# Patient Record
Sex: Female | Born: 2000 | Race: Black or African American | Hispanic: No | Marital: Single | Smoking: Never smoker
Health system: Southern US, Community
[De-identification: ages and names within clinical notes are randomized; demographics above are authoritative.]

## PROBLEM LIST (undated history)

## (undated) DIAGNOSIS — D649 Anemia, unspecified: Secondary | ICD-10-CM

## (undated) DIAGNOSIS — Z5189 Encounter for other specified aftercare: Secondary | ICD-10-CM

---
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
## 2021-08-13 ENCOUNTER — Encounter: Payer: Self-pay | Admitting: Emergency Medicine

## 2021-08-13 ENCOUNTER — Emergency Department
Admission: EM | Admit: 2021-08-13 | Discharge: 2021-08-13 | Disposition: A | Discharge status: Home / Self Care | Payer: Medicaid Other | Attending: Emergency Medicine | Admitting: Emergency Medicine

## 2021-08-13 ENCOUNTER — Emergency Department: Payer: Medicaid Other

## 2021-08-13 ENCOUNTER — Other Ambulatory Visit: Payer: Self-pay

## 2021-08-13 DIAGNOSIS — O99011 Anemia complicating pregnancy, first trimester: Secondary | ICD-10-CM | POA: Insufficient documentation

## 2021-08-13 DIAGNOSIS — O26899 Other specified pregnancy related conditions, unspecified trimester: Secondary | ICD-10-CM

## 2021-08-13 DIAGNOSIS — Z3A01 Less than 8 weeks gestation of pregnancy: Secondary | ICD-10-CM | POA: Diagnosis not present

## 2021-08-13 DIAGNOSIS — N9489 Other specified conditions associated with female genital organs and menstrual cycle: Secondary | ICD-10-CM | POA: Diagnosis not present

## 2021-08-13 DIAGNOSIS — O2341 Unspecified infection of urinary tract in pregnancy, first trimester: Secondary | ICD-10-CM | POA: Diagnosis not present

## 2021-08-13 DIAGNOSIS — D649 Anemia, unspecified: Secondary | ICD-10-CM | POA: Diagnosis not present

## 2021-08-13 DIAGNOSIS — O26891 Other specified pregnancy related conditions, first trimester: Secondary | ICD-10-CM | POA: Diagnosis present

## 2021-08-13 DIAGNOSIS — R102 Pelvic and perineal pain: Secondary | ICD-10-CM

## 2021-08-13 DIAGNOSIS — N390 Urinary tract infection, site not specified: Secondary | ICD-10-CM

## 2021-08-13 LAB — URINALYSIS, ROUTINE W REFLEX MICROSCOPIC
Bilirubin Urine: NEGATIVE
Glucose, UA: NEGATIVE mg/dL
Hgb urine dipstick: NEGATIVE
Ketones, ur: NEGATIVE mg/dL
Nitrite: NEGATIVE
Protein, ur: NEGATIVE mg/dL
Specific Gravity, Urine: 1.024 (ref 1.005–1.030)
pH: 5 (ref 5.0–8.0)

## 2021-08-13 LAB — COMPREHENSIVE METABOLIC PANEL
ALT: 12 U/L (ref 0–44)
AST: 14 U/L — ABNORMAL LOW (ref 15–41)
Albumin: 3.7 g/dL (ref 3.5–5.0)
Alkaline Phosphatase: 49 U/L (ref 38–126)
Anion gap: 4 — ABNORMAL LOW (ref 5–15)
BUN: 11 mg/dL (ref 6–20)
CO2: 24 mmol/L (ref 22–32)
Calcium: 8.9 mg/dL (ref 8.9–10.3)
Chloride: 106 mmol/L (ref 98–111)
Creatinine, Ser: 0.63 mg/dL (ref 0.44–1.00)
GFR, Estimated: >60 mL/min (ref >60)
Glucose, Bld: 94 mg/dL (ref 70–99)
Potassium: 3.6 mmol/L (ref 3.5–5.1)
Sodium: 134 mmol/L — ABNORMAL LOW (ref 135–145)
Total Bilirubin: 0.4 mg/dL (ref 0.3–1.2)
Total Protein: 7.4 g/dL (ref 6.5–8.1)

## 2021-08-13 LAB — CBC
HCT: 27.8 % — ABNORMAL LOW (ref 36.0–46.0)
Hemoglobin: 7.2 g/dL — ABNORMAL LOW (ref 12.0–15.0)
MCH: 16 pg — ABNORMAL LOW (ref 26.0–34.0)
MCHC: 25.9 g/dL — ABNORMAL LOW (ref 30.0–36.0)
MCV: 61.8 fL — ABNORMAL LOW (ref 80.0–100.0)
Platelets: 442 10*3/uL — ABNORMAL HIGH (ref 150–400)
RBC: 4.5 MIL/uL (ref 3.87–5.11)
RDW: 19.9 % — ABNORMAL HIGH (ref 11.5–15.5)
WBC: 7.1 10*3/uL (ref 4.0–10.5)
nRBC: 0 % (ref 0.0–0.2)

## 2021-08-13 LAB — POC URINE PREG, ED: Preg Test, Ur: POSITIVE — AB

## 2021-08-13 LAB — HCG, QUANTITATIVE, PREGNANCY: hCG, Beta Chain, Quant, S: 15964 m[IU]/mL — ABNORMAL HIGH (ref <5)

## 2021-08-13 LAB — LIPASE, BLOOD: Lipase: 61 U/L — ABNORMAL HIGH (ref 11–51)

## 2021-08-13 MED ORDER — FERROUS SULFATE 325 (65 FE) MG PO TABS: 325.0000 mg | ORAL_TABLET | Freq: Every day | ORAL | 3 refills | Status: DC

## 2021-08-13 MED ORDER — NITROFURANTOIN MONOHYD MACRO 100 MG PO CAPS
100.0000 mg | ORAL_CAPSULE | Freq: Once | ORAL | Status: AC
Start: 2021-08-13 — End: 2021-08-13
  Administered 2021-08-13: 100 mg via ORAL
  Filled 2021-08-13: qty 1

## 2021-08-13 MED ORDER — NITROFURANTOIN MONOHYD MACRO 100 MG PO CAPS: 100.0000 mg | ORAL_CAPSULE | Freq: Two times a day (BID) | ORAL | 0 refills | Status: AC

## 2021-08-13 NOTE — Discharge Instructions (Addendum)
I recommend that you continue daily prenatal vitamins.  You may take over-the-counter Tylenol 1000 mg every 6 hours as needed for pain.  I recommend follow-up with OB/GYN for repeat ultrasound in 10 to 14 days.

## 2021-08-13 NOTE — ED Notes (Signed)
Discharge instructions, script and follow-up information provided to patient. Patient verbalized understanding. Patient ambulated out to the waiting room with a steady gait. °

## 2021-08-13 NOTE — ED Provider Notes (Signed)
[facility] Provider Note    Event Date/Time   First MD Initiated Contact with Patient 08/13/21 [phone]     (approximate)   History   Abdominal Pain   HPI  Brenda Carpenter is a 21 y.o. female G1, P0 with history of iron deficiency anemia who presents to the emergency department with complaints of abdominal cramping in the lower abdomen for the past 2 days.  She states that she has had a positive pregnancy test at home and was concerned that something could be going on with her pregnancy.  She does not have an OB/GYN.  Her LMP was January 9.  She denies any vaginal bleeding, abnormal discharge, dysuria or hematuria.  No fevers, chills, nausea or vomiting.  States she did have some diarrhea that has resolved.  No bloody stools or melena.  Denies history of abdominal surgeries.  Denies history of previous STDs.      History provided by patient.    No past medical history on file.  No past surgical history on file.  MEDICATIONS:  Prior to Admission medications   Medication Sig Start Date End Date Taking? Authorizing Provider  docusate sodium (COLACE) 100 MG capsule Take 1 tablet once or twice daily as needed for constipation while taking iron supplements 05/20/20   Hinda Kehr, MD  ferrous sulfate 325 (65 FE) MG tablet Take 1 tablet (325 mg total) by mouth daily with breakfast. 05/20/20 06/19/20  Hinda Kehr, MD  ibuprofen (ADVIL,MOTRIN) 400 MG tablet Take 1 tablet (400 mg total) by mouth every 6 (six) hours as needed. 09/06/15   Hagler, Jami L, PA-C  ketorolac (TORADOL) 10 MG tablet Take 1 tablet (10 mg total) every 8 (eight) hours by mouth. Patient not taking: Reported on 01/29/2020 05/10/17   Menshew, Dannielle Karvonen, PA-C  medroxyPROGESTERone (PROVERA) 5 MG tablet Take 2 tablets (10 mg total) by mouth daily for 5 days. 05/20/20 05/25/20  Hinda Kehr, MD  metoCLOPramide (REGLAN) 5 MG tablet Take 1 tablet (5 mg total) every 8 (eight) hours as needed by mouth  for nausea or vomiting. Patient not taking: Reported on 01/29/2020 05/10/17   Melvenia Needles, PA-C    Physical Exam   Triage Vital Signs: ED Triage Vitals [08/13/21 0155]  Enc Vitals Group     BP (!) 108/56     Pulse Rate 79     Resp 16     Temp 98.8 F (37.1 C)     Temp Source Oral     SpO2 100 %     Weight 134 lb (60.8 kg)     Height 5\' 1"  (1.549 m)     Head Circumference      Peak Flow      Pain Score 5     Pain Loc      Pain Edu?      Excl. in [place]?     Most recent vital signs: Vitals:   08/13/21 0155  BP: (!) 108/56  Pulse: 79  Resp: 16  Temp: 98.8 F (37.1 C)  SpO2: 100%    CONSTITUTIONAL: Alert and oriented and responds appropriately to questions. Well-appearing; well-nourished HEAD: Normocephalic, atraumatic EYES: Conjunctivae clear, pupils appear equal, sclera nonicteric ENT: normal nose; moist mucous membranes NECK: Supple, normal ROM CARD: RRR; S1 and S2 appreciated; no murmurs, no clicks, no rubs, no gallops RESP: Normal chest excursion without splinting or tachypnea; breath sounds clear and equal bilaterally; no wheezes, no rhonchi, no rales, no hypoxia  or respiratory distress, speaking full sentences ABD/GI: Normal bowel sounds; non-distended; soft, non-tender, no rebound, no guarding, no peritoneal signs, no significant tenderness at McBurney's point BACK: The back appears normal EXT: Normal ROM in all joints; no deformity noted, no edema; no cyanosis SKIN: Normal color for age and race; warm; no rash on exposed skin NEURO: Moves all extremities equally, normal speech PSYCH: The patient's mood and manner are appropriate.   ED Results / Procedures / Treatments   LABS: (all labs ordered are listed, but only abnormal results are displayed) Labs Reviewed  COMPREHENSIVE METABOLIC PANEL - Abnormal; Notable for the following components:      Result Value   Sodium 134 (*)    AST 14 (*)    Anion gap 4 (*)    All other components within normal  limits  CBC - Abnormal; Notable for the following components:   Hemoglobin 7.2 (*)    HCT 27.8 (*)    MCV 61.8 (*)    MCH 16.0 (*)    MCHC 25.9 (*)    RDW 19.9 (*)    Platelets 442 (*)    All other components within normal limits  URINALYSIS, ROUTINE W REFLEX MICROSCOPIC - Abnormal; Notable for the following components:   Color, Urine YELLOW (*)    APPearance CLOUDY (*)    Leukocytes,Ua LARGE (*)    Bacteria, UA RARE (*)    All other components within normal limits  HCG, QUANTITATIVE, PREGNANCY - Abnormal; Notable for the following components:   hCG, Beta Chain, Quant, S 15,964 (*)    All other components within normal limits  LIPASE, BLOOD - Abnormal; Notable for the following components:   Lipase 61 (*)    All other components within normal limits  POC URINE PREG, ED - Abnormal; Notable for the following components:   Preg Test, Ur POSITIVE (*)    All other components within normal limits  URINE CULTURE     EKG:   RADIOLOGY: My personal review and interpretation of imaging: Ultrasound shows single IUP with no fetal cardiac activity yet.  I have personally reviewed all radiology reports.   US OB LESS THAN 14 WEEKS W/ OB TRANSVAGINAL AND DOPPLER  Result Date: 08/13/2021 CLINICAL DATA:  Initial evaluation for acute pelvic pain, early pregnancy. EXAM: OBSTETRIC <14 WK [place] AND TRANSVAGINAL OB US DOPPLER ULTRASOUND OF OVARIES TECHNIQUE: Both transabdominal and transvaginal ultrasound examinations were performed for complete evaluation of the gestation as well as the maternal uterus, adnexal regions, and pelvic cul-de-sac. Transvaginal technique was performed to assess early pregnancy. Color and duplex Doppler ultrasound was utilized to evaluate blood flow to the ovaries. COMPARISON:  Prior ultrasound from 05/20/2020. FINDINGS: Intrauterine gestational sac: Single Yolk sac:  Present Embryo:  Present Cardiac Activity: Negative. Heart Rate: N/A CRL:   2.3 mm   5 w 5 d                   [place] EDC: 04/10/2022 Subchorionic hemorrhage:  None visualized. Maternal uterus/adnexae: Left ovary unremarkable and normal in appearance. 1.6 cm complex cyst within the right ovary, most likely a hemorrhagic cyst and/or degenerating corpus luteal cyst. No other adnexal mass. Trace free fluid seen within the pelvis. Pulsed Doppler evaluation of both ovaries demonstrates normal appearing low-resistance arterial and venous waveforms. IMPRESSION: 1. Single IUP with internal yolk sac and embryo, crown-rump length measures 2.3 mm, but no detectable cardiac activity. Findings are suspicious but not yet definitive for failed pregnancy. Recommend follow-up US  in 10-14 days for definitive diagnosis. This recommendation follows SRU consensus guidelines: Diagnostic Criteria for Nonviable Pregnancy Early in the First Trimester. [employer] Med 2013; [id number]. 2. 1.6 cm complex right ovarian cyst, likely a small hemorrhagic cyst or degenerating corpus luteal cyst. Associated trace free fluid within the pelvis. 3. No other acute abnormality within the pelvis. No evidence for ovarian torsion. Electronically Signed   By: Jeannine Boga M.D.   On: 08/13/2021 04:49     PROCEDURES:  Critical Care performed: No   CRITICAL CARE Performed by: Cyril Mourning Aleina Burgio   Total critical care time: 0 minutes  Critical care time was exclusive of separately billable procedures and treating other patients.  Critical care was necessary to treat or prevent imminent or life-threatening deterioration.  Critical care was time spent personally by me on the following activities: development of treatment plan with patient and/or surrogate as well as nursing, discussions with consultants, evaluation of patient's response to treatment, examination of patient, obtaining history from patient or surrogate, ordering and performing treatments and interventions, ordering and review of laboratory studies, ordering and review of radiographic studies,  pulse oximetry and re-evaluation of patient's condition.   Procedures    IMPRESSION / MDM / ASSESSMENT AND PLAN / ED COURSE  I reviewed the triage vital signs and the nursing notes.    Patient here with complaints of pelvic pain in pregnancy.  No bleeding.    DIFFERENTIAL DIAGNOSIS (includes but not limited to):   Pregnancy, ectopic, miscarriage, TOA, PID, torsion, UTI, kidney stone, pyelonephritis, less likely appendicitis, colitis, diverticulitis   PLAN: We will obtain CBC, CMP, lipase, urinalysis, hCG, transvaginal ultrasound.  Patient declines any pain medication.   MEDICATIONS GIVEN IN ED: Medications  nitrofurantoin (macrocrystal-monohydrate) (MACROBID) capsule 100 mg (100 mg Oral Given 08/13/21 0335)     ED COURSE: Patient's labs show no leukocytosis.  She is anemic but this appears stable and chronic.  She denies any bloody stools, melena, vaginal bleeding.  She is on iron tablets.  Her urine also appears infected versus contaminated.  We will add on a urine culture and start her on Macrobid given she is pregnant.  hCG is greater than 15,000.  LFTs normal.  Lipase is just minimally elevated at 61.  Abdominal exam is benign.  Will obtain transvaginal ultrasound.  Patient declines pelvic and STD screening today.   Ultrasound images reviewed by myself and radiologist.  Ultrasound shows a single IUP measuring about 5 weeks and 5 days but no detectable cardiac activity.  Discussed with patient that this is concerning for possible failed pregnancy but also could just be because it is so early on and we recommend repeat ultrasound as an outpatient in 10 to 14 days.  She does have a complex right ovarian cyst but no torsion.  Will give outpatient OB/GYN follow-up.  Recommended Tylenol as needed for pain control.  Will discharge with Macrobid for her UTI.   At this time, I do not feel there is any life-threatening condition present. I reviewed all nursing notes, vitals, pertinent  previous records.  All lab and urine results, EKGs, imaging ordered have been independently reviewed and interpreted by myself.  I reviewed all available radiology reports from any imaging ordered this visit.  Based on my assessment, I feel the patient is safe to be discharged home without further emergent workup and can continue workup as an outpatient as needed. Discussed all findings, treatment plan as well as usual and customary return precautions with patient.  They verbalize understanding and are comfortable with this plan.  Outpatient follow-up has been provided as needed.  All questions have been answered.    CONSULTS: No OB/GYN consultation needed given no ectopic and no torsion seen.   OUTSIDE RECORDS REVIEWED: No previous pertinent records for review other than ED notes.         FINAL CLINICAL IMPRESSION(S) / ED DIAGNOSES   Final diagnoses:  Pelvic pain in pregnancy  Anemia, unspecified type  Acute UTI     Rx / DC Orders   ED Discharge Orders          Ordered    nitrofurantoin, macrocrystal-monohydrate, (MACROBID) 100 MG capsule  2 times daily        08/13/21 0458    ferrous sulfate 325 (65 FE) MG tablet  Daily        08/13/21 0458             Note:  This document was prepared using Dragon voice recognition software and may include unintentional dictation errors.   Meshilem Machuca, Delice Bison, DO 08/13/21 [phone]

## 2021-08-13 NOTE — ED Notes (Signed)
Provider at bedside to update patient on test results and plan of care ?

## 2021-08-13 NOTE — ED Triage Notes (Signed)
Pt states generalized abd pain since this pm with diarrhea. Pt states she is [redacted] weeks pregnant, denies vomiting, nausea, fever or vaginal bleeding.

## 2021-08-13 NOTE — ED Notes (Signed)
Ultrasound at bedside to perform exam

## 2021-08-14 LAB — URINE CULTURE: Culture: 3000 — AB

## 2021-11-18 ENCOUNTER — Encounter: Payer: Self-pay | Admitting: Obstetrics and Gynecology

## 2021-11-18 ENCOUNTER — Observation Stay
Admission: EM | Admit: 2021-11-18 | Discharge: 2021-11-19 | Disposition: A | Discharge status: Home / Self Care | Payer: Medicaid Other | Attending: Obstetrics | Admitting: Obstetrics

## 2021-11-18 DIAGNOSIS — Z3A20 20 weeks gestation of pregnancy: Secondary | ICD-10-CM | POA: Insufficient documentation

## 2021-11-18 DIAGNOSIS — O219 Vomiting of pregnancy, unspecified: Secondary | ICD-10-CM | POA: Diagnosis not present

## 2021-11-18 DIAGNOSIS — O368120 Decreased fetal movements, second trimester, not applicable or unspecified: Principal | ICD-10-CM | POA: Insufficient documentation

## 2021-11-18 DIAGNOSIS — O26892 Other specified pregnancy related conditions, second trimester: Secondary | ICD-10-CM | POA: Insufficient documentation

## 2021-11-18 DIAGNOSIS — R197 Diarrhea, unspecified: Secondary | ICD-10-CM | POA: Diagnosis not present

## 2021-11-18 DIAGNOSIS — O99891 Other specified diseases and conditions complicating pregnancy: Secondary | ICD-10-CM | POA: Insufficient documentation

## 2021-11-18 DIAGNOSIS — Z91010 Allergy to peanuts: Secondary | ICD-10-CM | POA: Insufficient documentation

## 2021-11-18 DIAGNOSIS — O99012 Anemia complicating pregnancy, second trimester: Secondary | ICD-10-CM | POA: Insufficient documentation

## 2021-11-18 MED ORDER — ACETAMINOPHEN 500 MG PO TABS
1000.0000 mg | ORAL_TABLET | Freq: Four times a day (QID) | ORAL | Status: DC | PRN
  Administered 2021-11-18: 1000 mg via ORAL
  Filled 2021-11-18: qty 2

## 2021-11-18 MED ORDER — ONDANSETRON HCL 4 MG/2ML IJ SOLN
4.0000 mg | Freq: Four times a day (QID) | INTRAMUSCULAR | Status: DC | PRN
  Administered 2021-11-18: 4 mg via INTRAVENOUS
  Filled 2021-11-18: qty 2

## 2021-11-18 MED ORDER — LACTATED RINGERS IV SOLN: Freq: Once | INTRAVENOUS | Status: AC

## 2021-11-18 NOTE — OB Triage Note (Signed)
Kathe Mariner, CNM on unit at nurses station and notified of patient's arrival and chief complaint. Provider acknowledged receipt of SBAR including FHt status and tocometer reading of contraction pattern. Awaiting new orders.

## 2021-11-18 NOTE — OB Triage Note (Signed)
Patient arrived to unit wheeled by ED staff with complaints of abdominal pain, diarrhea, and decreased fetal movement. Patient self reports that this may be a result of food poisoning from eating a donut. Patient denies LOF or vaginal bleeding. Patient abdomen is soft to touch. FHT confirmed at 145 with handheld doppler and tocometer placed on non tender area of abdomen. Patient reports she is seen out patient and [facility] where she is seen by Midwives and was told to go to the nearest hospital if she feels decreased fetal movement. Will notify service provider on call of patient's arrival.

## 2021-11-18 NOTE — Discharge Summary (Signed)
Brenda Carpenter is a 21 y.o. female. She is at [redacted]w[redacted]d gestation. No LMP recorded. Patient is pregnant. Estimated Date of Delivery: 04/10/22  Prenatal care site:  [facility]  Chief complaint: diarrhea and decreased fetal movement  HPI: Brenda Carpenter presents to L&D with complaints of diarrhea since yesterday accompanied by n/v and decreased fetal movement  Factors complicating pregnancy: Normal first pregnancy Chlamydia in first trimester, TOC done 11/09/21 Anemia hgb 8.8   S: Resting comfortably. no CTX, no VB.no LOF,  Active fetal movement.   Maternal Medical History:  Past Medical Hx:  has no past medical history on file.    Past Surgical Hx:  has no past surgical history on file.   Allergies  Allergen Reactions   Peanut-Containing Drug Products Itching and Swelling     Prior to Admission medications   Medication Sig Start Date End Date Taking? Authorizing Provider  docusate sodium (COLACE) 100 MG capsule Take 1 tablet once or twice daily as needed for constipation while taking iron supplements 05/20/20   Loleta Rose, MD  ferrous sulfate 325 (65 FE) MG tablet Take 1 tablet (325 mg total) by mouth daily with breakfast. 05/20/20 06/19/20  Loleta Rose, MD  ferrous sulfate 325 (65 FE) MG tablet Take 1 tablet (325 mg total) by mouth daily. 08/13/21 12/11/21  Ward, Layla Maw, DO    Social History: She  reports that she has never smoked. She has never used smokeless tobacco. She reports that she does not drink alcohol and does not use drugs.  Family History: family history is not on file. ,no history of gyn cancers  Review of Systems: A full review of systems was performed and negative except as noted in the HPI.    O:  BP 99/62   Pulse 92   Temp 98.2 F (36.8 C) (Oral)   Resp 16  No results found for this or any previous visit (from the past 48 hour(s)).   Constitutional: NAD, AAOx3  HE/ENT: extraocular movements grossly intact, moist mucous membranes CV: RRR PULM: nl respiratory  effort Abd: gravid, non-tender, non-distended, soft  Ext: Non-tender, Nonedmeatous Psych: mood appropriate, speech normal Pelvic : deferred SVE:     Fetal Monitor: Doppler-> 145 done by staff RN   Assessment: 21 y.o. [redacted]w[redacted]d here for antenatal surveillance during pregnancy.  Principle diagnosis: 20 weeks diarrhea and decreased fetal movement   Plan: Labor: not present.  IV fluids IV Zofran D/c home stable, precautions reviewed, follow-up as scheduled.   ----- Chari Manning, CNM Certified Nurse Midwife [facility]

## 2021-11-19 DIAGNOSIS — O368120 Decreased fetal movements, second trimester, not applicable or unspecified: Secondary | ICD-10-CM | POA: Diagnosis not present

## 2021-12-13 ENCOUNTER — Other Ambulatory Visit: Payer: Self-pay

## 2021-12-13 ENCOUNTER — Observation Stay
Admission: EM | Admit: 2021-12-13 | Discharge: 2021-12-14 | Disposition: A | Discharge status: Home / Self Care | Payer: Medicaid Other | Attending: Obstetrics and Gynecology | Admitting: Obstetrics and Gynecology

## 2021-12-13 ENCOUNTER — Encounter: Payer: Self-pay | Admitting: Obstetrics and Gynecology

## 2021-12-13 DIAGNOSIS — R1030 Lower abdominal pain, unspecified: Secondary | ICD-10-CM | POA: Diagnosis not present

## 2021-12-13 DIAGNOSIS — O99013 Anemia complicating pregnancy, third trimester: Secondary | ICD-10-CM | POA: Diagnosis not present

## 2021-12-13 DIAGNOSIS — Z3A23 23 weeks gestation of pregnancy: Secondary | ICD-10-CM | POA: Insufficient documentation

## 2021-12-13 DIAGNOSIS — Z91010 Allergy to peanuts: Secondary | ICD-10-CM | POA: Insufficient documentation

## 2021-12-13 DIAGNOSIS — M545 Low back pain, unspecified: Secondary | ICD-10-CM | POA: Insufficient documentation

## 2021-12-13 DIAGNOSIS — O26893 Other specified pregnancy related conditions, third trimester: Principal | ICD-10-CM | POA: Insufficient documentation

## 2021-12-13 DIAGNOSIS — A5901 Trichomonal vulvovaginitis: Secondary | ICD-10-CM

## 2021-12-13 DIAGNOSIS — O99891 Other specified diseases and conditions complicating pregnancy: Secondary | ICD-10-CM | POA: Diagnosis not present

## 2021-12-13 DIAGNOSIS — Z79899 Other long term (current) drug therapy: Secondary | ICD-10-CM | POA: Insufficient documentation

## 2021-12-13 DIAGNOSIS — D509 Iron deficiency anemia, unspecified: Secondary | ICD-10-CM | POA: Insufficient documentation

## 2021-12-13 DIAGNOSIS — M549 Dorsalgia, unspecified: Secondary | ICD-10-CM | POA: Diagnosis present

## 2021-12-13 LAB — WET PREP, GENITAL
Clue Cells Wet Prep HPF POC: NONE SEEN
Sperm: NONE SEEN
WBC, Wet Prep HPF POC: >=10 — AB (ref <10)
Yeast Wet Prep HPF POC: NONE SEEN

## 2021-12-13 LAB — URINALYSIS, COMPLETE (UACMP) WITH MICROSCOPIC
Bilirubin Urine: NEGATIVE
Glucose, UA: NEGATIVE mg/dL
Hgb urine dipstick: NEGATIVE
Ketones, ur: NEGATIVE mg/dL
Nitrite: NEGATIVE
Protein, ur: 30 mg/dL — AB
Specific Gravity, Urine: 1.028 (ref 1.005–1.030)
pH: 5 (ref 5.0–8.0)

## 2021-12-13 MED ORDER — METRONIDAZOLE 500 MG PO TABS
2000.0000 mg | ORAL_TABLET | Freq: Once | ORAL | Status: AC
  Administered 2021-12-14: 2000 mg via ORAL
  Filled 2021-12-13: qty 4

## 2021-12-13 MED ORDER — SODIUM CHLORIDE 0.9 % IV BOLUS
1000.0000 mL | Freq: Once | INTRAVENOUS | Status: AC
Start: 2021-12-14 — End: 2021-12-14
  Administered 2021-12-14: 1000 mL via INTRAVENOUS

## 2021-12-13 MED ORDER — SODIUM CHLORIDE 0.9 % IV SOLN
300.0000 mg | Freq: Once | INTRAVENOUS | Status: DC
  Filled 2021-12-13: qty 15

## 2021-12-13 MED ORDER — ONDANSETRON 4 MG PO TBDP
4.0000 mg | ORAL_TABLET | Freq: Once | ORAL | Status: DC
Start: 2021-12-14 — End: 2021-12-14

## 2021-12-13 MED ORDER — LACTATED RINGERS IV BOLUS: 1000.0000 mL | Freq: Once | INTRAVENOUS | Status: DC

## 2021-12-13 NOTE — OB Triage Note (Signed)
Patient arrived in triage with c/o constant lower back pain and abdominal pressure x 2 hours. Patient receives prenatal care at [facility]. Denies leaking of fluid, vaginal bleeding, or additional problems this pregnancy. Reports feeling fetal movement.

## 2021-12-13 NOTE — Discharge Summary (Signed)
Brenda Carpenter is a 21 y.o. female. She is at [redacted]w[redacted]d gestation. No LMP recorded. Patient is pregnant. Estimated Date of Delivery: 04/10/22  Prenatal care site:  [facility] - unassigned.   Chief complaint: low back pain  HPI: Myli presents to L&D with complaints of low back pain that started a few hours ago and worsened over the last hour.  - denies contractions, but notes some lower abdominal/suprapubic pain that is constant.  - feeling fetal movement - seen yesterday for anatomy US, cervical length 42.44mm - seen in office on 12/09/21  Factors complicating pregnancy: Chlamydia in first trimester, TOC done 11/09/21- POS, repeat TOC 12/09/21- NEG Anemia hgb 8.8, repeat 6/16: hgb 7.1   S: Resting comfortably. no CTX, no VB.no LOF,  Active fetal movement.   Maternal Medical History:  Past Medical Hx:  has no past medical history on file.    Past Surgical Hx:  has no past surgical history on file.   Allergies  Allergen Reactions   Peanut-Containing Drug Products Itching and Swelling     Prior to Admission medications   Medication Sig Start Date End Date Taking? Authorizing Provider  docusate sodium (COLACE) 100 MG capsule Take 1 tablet once or twice daily as needed for constipation while taking iron supplements 05/20/20   Loleta Rose, MD  ferrous sulfate 325 (65 FE) MG tablet Take 1 tablet (325 mg total) by mouth daily with breakfast. 05/20/20 06/19/20  Loleta Rose, MD  ferrous sulfate 325 (65 FE) MG tablet Take 1 tablet (325 mg total) by mouth daily. 08/13/21 12/11/21  Ward, Layla Maw, DO    Social History: She  reports that she has never smoked. She has never used smokeless tobacco. She reports that she does not drink alcohol and does not use drugs.  Family History: family history is not on file.   Review of Systems: A full review of systems was performed and negative except as noted in the HPI.    O:  BP 109/61 (BP Location: Left Arm)   Pulse (!) 102   Temp 99.1 F (37.3 C)    Resp 19   Ht 5\' 2"  (1.575 m)   Wt 61.7 kg   BMI 24.87 kg/m  Results for orders placed or performed during the hospital encounter of 12/13/21 (from the past 48 hour(s))  Wet prep, genital   Collection Time: 12/13/21 11:24 PM   Specimen: Cervix  Result Value Ref Range   Yeast Wet Prep HPF POC NONE SEEN NONE SEEN   Trich, Wet Prep PRESENT (A) NONE SEEN   Clue Cells Wet Prep HPF POC NONE SEEN NONE SEEN   WBC, Wet Prep HPF POC >=10 (A) <10   Sperm NONE SEEN   Chlamydia/NGC rt PCR ([facility] only)   Collection Time: 12/13/21 11:24 PM   Specimen: Cervix  Result Value Ref Range   Specimen source GC/Chlam ENDOCERVICAL    Chlamydia Tr NOT DETECTED NOT DETECTED   N gonorrhoeae NOT DETECTED NOT DETECTED  Urinalysis, Complete w Microscopic Urine, Clean Catch   Collection Time: 12/13/21 11:24 PM  Result Value Ref Range   Color, Urine YELLOW (A) YELLOW   APPearance CLOUDY (A) CLEAR   Specific Gravity, Urine 1.028 1.005 - 1.030   pH 5.0 5.0 - 8.0   Glucose, UA NEGATIVE NEGATIVE mg/dL   Hgb urine dipstick NEGATIVE NEGATIVE   Bilirubin Urine NEGATIVE NEGATIVE   Ketones, ur NEGATIVE NEGATIVE mg/dL   Protein, ur 30 (A) NEGATIVE mg/dL   Nitrite NEGATIVE NEGATIVE  Leukocytes,Ua LARGE (A) NEGATIVE   RBC / HPF 11-20 0 - 5 RBC/hpf   WBC, UA 11-20 0 - 5 WBC/hpf   Bacteria, UA RARE (A) NONE SEEN   Squamous Epithelial / LPF 11-20 0 - 5   Mucus PRESENT      Constitutional: NAD, AAOx3  HE/ENT: extraocular movements grossly intact, moist mucous membranes CV: RRR PULM: nl respiratory effort Abd: gravid, non-tender, non-distended, soft  Ext: Non-tender, Nonedmeatous Psych: mood appropriate, speech normal Pelvic :  SSE: cervix visually long and closed, small amount Peart cloudy discharge; cervix friable with yellow green discharge noted.   Fetal Monitoring: 140bpm, mod variability, no accels, no decels, appropriate for GA  TOCO: mild UI noted.    Assessment: 21 y.o. G1P0 at [redacted]w[redacted]d here for  antenatal surveillance during pregnancy.  Principle diagnosis: Back pain affecting pregnancy in second trimester [O99.891, M54.9]   Plan: Preterm labor: not present.  IV fluids- bolus given IV Venofer ordered due to severe iron deficiency anemia, Hgb 7.1 at outside facility. Pt declined.   UA sent- poor hydration Wet prep- Pos trich, Rx Zofran then flagyl 2gm PO x 1 dose GC/CT neg/neg D/c home stable, precautions reviewed, follow-up as scheduled.   Randa Ngo, CNM  Certified Nurse Midwife [facility] 12/14/2021 1:51 AM

## 2021-12-14 DIAGNOSIS — O26893 Other specified pregnancy related conditions, third trimester: Secondary | ICD-10-CM | POA: Diagnosis not present

## 2021-12-14 LAB — CHLAMYDIA/NGC RT PCR (ARMC ONLY)
Chlamydia Tr: NOT DETECTED
N gonorrhoeae: NOT DETECTED

## 2021-12-14 MED ORDER — ONDANSETRON HCL 4 MG/2ML IJ SOLN
4.0000 mg | Freq: Four times a day (QID) | INTRAMUSCULAR | Status: DC | PRN
Start: 2021-12-14 — End: 2021-12-14
  Administered 2021-12-14: 4 mg via INTRAVENOUS
  Filled 2021-12-14: qty 2

## 2021-12-14 MED ORDER — ACETAMINOPHEN 500 MG PO TABS
1000.0000 mg | ORAL_TABLET | Freq: Four times a day (QID) | ORAL | Status: DC
  Administered 2021-12-14: 1000 mg via ORAL

## 2021-12-14 MED ORDER — ACETAMINOPHEN 500 MG PO TABS
ORAL_TABLET | ORAL | Status: AC
  Filled 2021-12-14: qty 2

## 2021-12-14 NOTE — Progress Notes (Signed)
Difficulty continuously monitoring fetal heart tones due to fetal size. RN at bedside to adjust monitor.

## 2022-02-16 ENCOUNTER — Encounter: Payer: Self-pay | Admitting: Obstetrics and Gynecology

## 2022-02-16 ENCOUNTER — Other Ambulatory Visit: Payer: Self-pay

## 2022-02-16 ENCOUNTER — Observation Stay
Admission: EM | Admit: 2022-02-16 | Discharge: 2022-02-17 | Disposition: A | Discharge status: Home / Self Care | Payer: Medicaid Other | Attending: Certified Nurse Midwife | Admitting: Certified Nurse Midwife

## 2022-02-16 DIAGNOSIS — Z91010 Allergy to peanuts: Secondary | ICD-10-CM | POA: Diagnosis not present

## 2022-02-16 DIAGNOSIS — R519 Headache, unspecified: Secondary | ICD-10-CM | POA: Insufficient documentation

## 2022-02-16 DIAGNOSIS — O26893 Other specified pregnancy related conditions, third trimester: Secondary | ICD-10-CM | POA: Diagnosis present

## 2022-02-16 DIAGNOSIS — O99891 Other specified diseases and conditions complicating pregnancy: Secondary | ICD-10-CM | POA: Diagnosis not present

## 2022-02-16 DIAGNOSIS — Z79899 Other long term (current) drug therapy: Secondary | ICD-10-CM | POA: Insufficient documentation

## 2022-02-16 DIAGNOSIS — Z3A32 32 weeks gestation of pregnancy: Secondary | ICD-10-CM | POA: Insufficient documentation

## 2022-02-16 LAB — PROTEIN / CREATININE RATIO, URINE
Creatinine, Urine: 82 mg/dL
Protein Creatinine Ratio: 0.11 mg/mg{Cre} (ref 0.00–0.15)
Total Protein, Urine: 9 mg/dL

## 2022-02-16 LAB — CBC
HCT: 40 % (ref 36.0–46.0)
Hemoglobin: 12.6 g/dL (ref 12.0–15.0)
MCH: 25.7 pg — ABNORMAL LOW (ref 26.0–34.0)
MCHC: 31.5 g/dL (ref 30.0–36.0)
MCV: 81.5 fL (ref 80.0–100.0)
Platelets: 208 10*3/uL (ref 150–400)
RBC: 4.91 MIL/uL (ref 3.87–5.11)
RDW: 26.6 % — ABNORMAL HIGH (ref 11.5–15.5)
WBC: 8.6 10*3/uL (ref 4.0–10.5)
nRBC: 0 % (ref 0.0–0.2)

## 2022-02-16 LAB — COMPREHENSIVE METABOLIC PANEL
ALT: 13 U/L (ref 0–44)
AST: 17 U/L (ref 15–41)
Albumin: 3.2 g/dL — ABNORMAL LOW (ref 3.5–5.0)
Alkaline Phosphatase: 85 U/L (ref 38–126)
Anion gap: 8 (ref 5–15)
BUN: 10 mg/dL (ref 6–20)
CO2: 21 mmol/L — ABNORMAL LOW (ref 22–32)
Calcium: 9.7 mg/dL (ref 8.9–10.3)
Chloride: 107 mmol/L (ref 98–111)
Creatinine, Ser: 0.49 mg/dL (ref 0.44–1.00)
GFR, Estimated: >60 mL/min (ref >60)
Glucose, Bld: 73 mg/dL (ref 70–99)
Potassium: 3.9 mmol/L (ref 3.5–5.1)
Sodium: 136 mmol/L (ref 135–145)
Total Bilirubin: 0.4 mg/dL (ref 0.3–1.2)
Total Protein: 7.2 g/dL (ref 6.5–8.1)

## 2022-02-16 MED ORDER — DIPHENHYDRAMINE HCL 25 MG PO CAPS
25.0000 mg | ORAL_CAPSULE | Freq: Once | ORAL | Status: AC
Start: 2022-02-16 — End: 2022-02-16
  Administered 2022-02-16: 25 mg via ORAL
  Filled 2022-02-16: qty 1

## 2022-02-16 MED ORDER — MAGNESIUM OXIDE -MG SUPPLEMENT 400 (240 MG) MG PO TABS
400.0000 mg | ORAL_TABLET | Freq: Once | ORAL | Status: AC
  Administered 2022-02-16: 400 mg via ORAL
  Filled 2022-02-16: qty 1

## 2022-02-16 MED ORDER — ACETAMINOPHEN 325 MG PO TABS
650.0000 mg | ORAL_TABLET | ORAL | Status: DC | PRN
  Administered 2022-02-16: 650 mg via ORAL
  Filled 2022-02-16 (×2): qty 2

## 2022-02-16 MED ORDER — CALCIUM CARBONATE ANTACID 500 MG PO CHEW: 2.0000 | CHEWABLE_TABLET | ORAL | Status: DC | PRN

## 2022-02-16 NOTE — OB Triage Note (Signed)
Pt states that she has an unrelieved HA since Monday. Pt reports taking tylenol as late as 1700 with no relief. Pt denies any ctx's, LOF, or bleeding.

## 2022-02-17 DIAGNOSIS — O99891 Other specified diseases and conditions complicating pregnancy: Secondary | ICD-10-CM | POA: Diagnosis not present

## 2022-02-17 MED ORDER — PROCHLORPERAZINE EDISYLATE 10 MG/2ML IJ SOLN
10.0000 mg | Freq: Four times a day (QID) | INTRAMUSCULAR | Status: DC | PRN
  Administered 2022-02-17: 10 mg via INTRAVENOUS
  Filled 2022-02-17: qty 2

## 2022-02-17 MED ORDER — LACTATED RINGERS IV SOLN: INTRAVENOUS | Status: DC

## 2022-02-17 MED ORDER — METOCLOPRAMIDE HCL 5 MG/ML IJ SOLN
10.0000 mg | Freq: Once | INTRAMUSCULAR | Status: AC
Start: 2022-02-17 — End: 2022-02-17
  Administered 2022-02-17: 10 mg via INTRAVENOUS
  Filled 2022-02-17: qty 2

## 2022-02-17 NOTE — Discharge Summary (Cosign Needed Addendum)
Brenda Carpenter is a 21 y.o. female. She is at [redacted]w[redacted]d gestation. No LMP recorded. Patient is pregnant. Estimated Date of Delivery: 04/10/22  Prenatal care site: [facility] - unassigned  Current pregnancy complicated by: anemia  Chief complaint: she reports a headache since Monday. She has taken Tylenol without relief. Denies changes of vision or RUQ pain.  S: Resting comfortably. no CTX, no VB.no LOF,  Active fetal movement. Denies: visual changes, SOB, or RUQ/epigastric pain  Maternal Medical History:  History reviewed. No pertinent past medical history.  History reviewed. No pertinent surgical history.  Allergies  Allergen Reactions   Peanut-Containing Drug Products Itching and Swelling    Prior to Admission medications   Medication Sig Start Date End Date Taking? Authorizing Provider  docusate sodium (COLACE) 100 MG capsule Take 1 tablet once or twice daily as needed for constipation while taking iron supplements Patient not taking: Reported on 12/13/2021 05/20/20   Loleta Rose, MD  ferrous sulfate 325 (65 FE) MG tablet Take 1 tablet (325 mg total) by mouth daily with breakfast. 05/20/20 06/19/20  Loleta Rose, MD  ferrous sulfate 325 (65 FE) MG tablet Take 1 tablet (325 mg total) by mouth daily. 08/13/21 12/11/21  Ward, Layla Maw, DO  Prenatal Vit-Fe Fumarate-FA (MULTIVITAMIN-PRENATAL) 27-0.8 MG TABS tablet Take 1 tablet by mouth daily at 12 noon.    [provider]      Social History: She  reports that she has never smoked. She has never used smokeless tobacco. She reports that she does not drink alcohol and does not use drugs.  Family History: family history is not on file.  no history of gyn cancers  Review of Systems: A full review of systems was performed and negative except as noted in the HPI.     O:  BP 112/69 (BP Location: Left Arm)   Pulse 91   Temp 98.5 F (36.9 C) (Oral)   Resp 18   Ht 5\' 2"  (1.575 m)   Wt 68.5 kg   BMI 27.62 kg/m  Results for orders  placed or performed during the hospital encounter of 02/16/22 (from the past 48 hour(s))  Comprehensive metabolic panel   Collection Time: 02/16/22 11:11 PM  Result Value Ref Range   Sodium 136 135 - 145 mmol/L   Potassium 3.9 3.5 - 5.1 mmol/L   Chloride 107 98 - 111 mmol/L   CO2 21 (L) 22 - 32 mmol/L   Glucose, Bld 73 70 - 99 mg/dL   BUN 10 6 - 20 mg/dL   Creatinine, Ser 02/18/22 0.44 - 1.00 mg/dL   Calcium 9.7 8.9 - 8.46 mg/dL   Total Protein 7.2 6.5 - 8.1 g/dL   Albumin 3.2 (L) 3.5 - 5.0 g/dL   AST 17 15 - 41 U/L   ALT 13 0 - 44 U/L   Alkaline Phosphatase 85 38 - 126 U/L   Total Bilirubin 0.4 0.3 - 1.2 mg/dL   GFR, Estimated 96.2 >95 mL/min   Anion gap 8 5 - 15  Protein / creatinine ratio, urine   Collection Time: 02/16/22 11:11 PM  Result Value Ref Range   Creatinine, Urine 82 mg/dL   Total Protein, Urine 9 mg/dL   Protein Creatinine Ratio 0.11 0.00 - 0.15 mg/mg[Cre]  CBC   Collection Time: 02/16/22 11:11 PM  Result Value Ref Range   WBC 8.6 4.0 - 10.5 K/uL   RBC 4.91 3.87 - 5.11 MIL/uL   Hemoglobin 12.6 12.0 - 15.0 g/dL   HCT 40.0  36.0 - 46.0 %   MCV 81.5 80.0 - 100.0 fL   MCH 25.7 (L) 26.0 - 34.0 pg   MCHC 31.5 30.0 - 36.0 g/dL   RDW 98.9 (H) 21.1 - 94.1 %   Platelets 208 150 - 400 K/uL   nRBC 0.0 0.0 - 0.2 %     Constitutional: NAD, AAOx3  HE/ENT: extraocular movements grossly intact, moist mucous membranes CV: RRR PULM: nl respiratory effort, CTABL     Abd: gravid, non-tender, non-distended, soft      Ext: Non-tender, Nonedematous   Psych: mood appropriate, speech normal Pelvic: deferred  Fetal  monitoring: Cat 1 Appropriate for GA Baseline: 120bpm Variability: moderate Accelerations: one 15x15 Decelerations absent  Bedside BPP: Tone: 2 Movement: 2 Breathing Movements: 2 AFI: 2 NST: 0 Total: 8/10  Bedside AFI: Q1: 2.48cm Q2: 3.2cm Q3: 2.2cm Q4: 3.5cm Total: 11.38cm  A/P: 21 y.o. [redacted]w[redacted]d here for antenatal surveillance for headache  Principle  Diagnosis:  Headache in pregnancy in third trimester  Headache improved from 10/10 to 6/10 pain with Tylenol 1000mg , magnesium oxide 400mg  and Benadryl 25mg  PO. During bedside BPP she reports her headache returning. Administered Compazine 10mg  IV and Reglan 10mg  IV. Headache improved after Compazine and Reglan. Advised to follow-up with [facility] for headache management. PIH: not present, labs WNL, BP WNL Labor: not present.  Fetal Wellbeing: Reassuring Cat 1 tracing. Non-reactive NST, one 15x15bpm acceleration. Good fetal movement. Bedside BPP 8/10, AFI 11.38cm. D/c home stable, precautions reviewed, follow-up as scheduled.    , CNM 02/17/2022 5:06 AM

## 2022-11-13 IMAGING — US US OB < 14 WKS - US OB TV - US DOPPLER
1 series · 13 of 28 positions shown · non-contrast
Comparison: Prior ultrasound from 05/20/2020.

CLINICAL DATA: Initial evaluation for acute pelvic pain, early
pregnancy.



[Series 1: us ob less than 14 weeks w/ ob transvaginal and do · 13 of 95 slices shown]
[im 4/95]
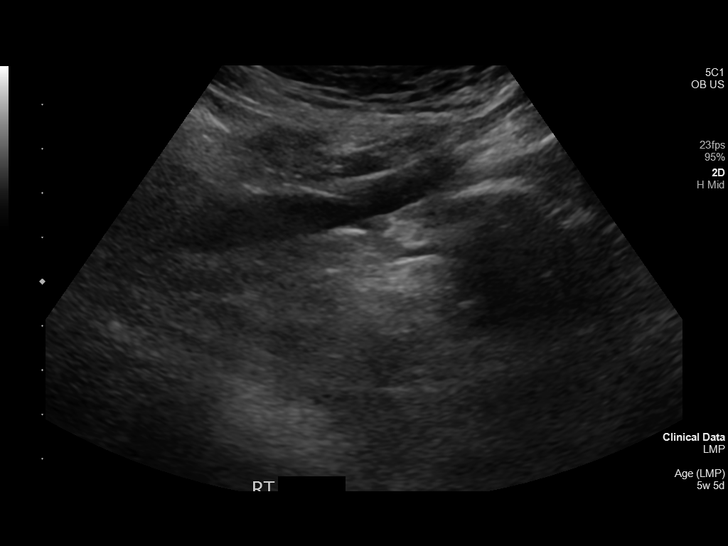
[im 11/95]
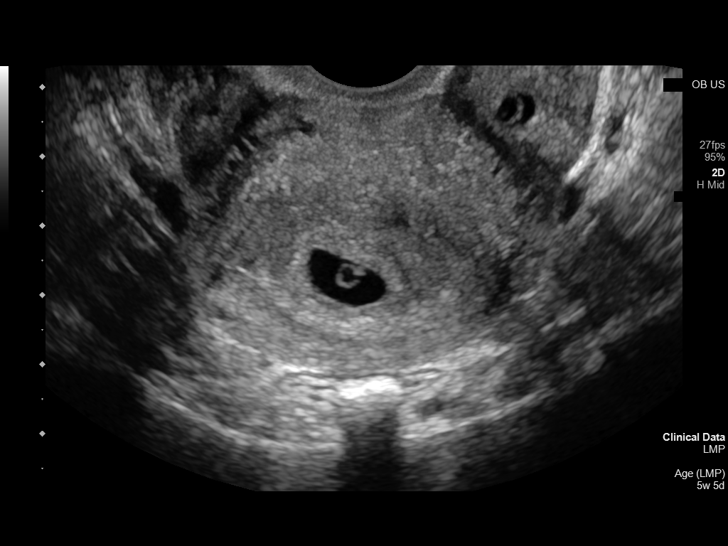
[im 18/95]
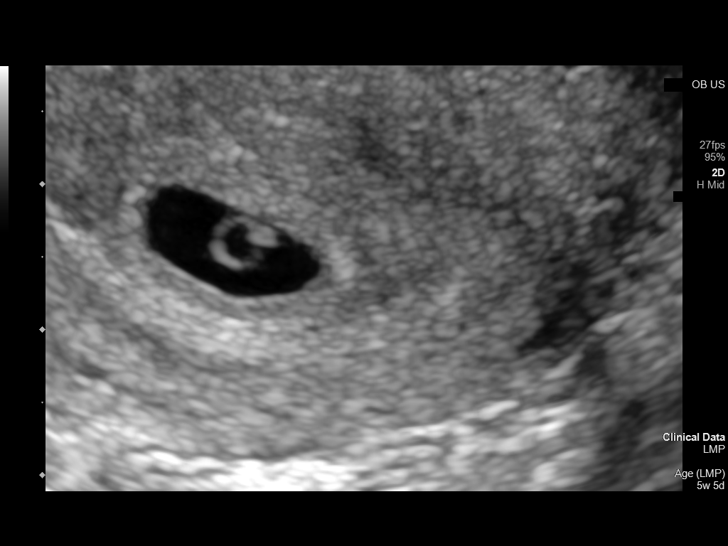
[im 25/95]
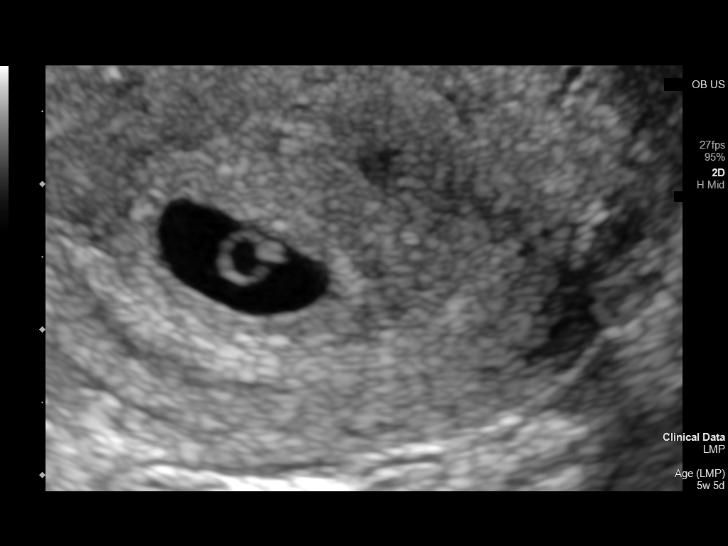
[im 32/95]
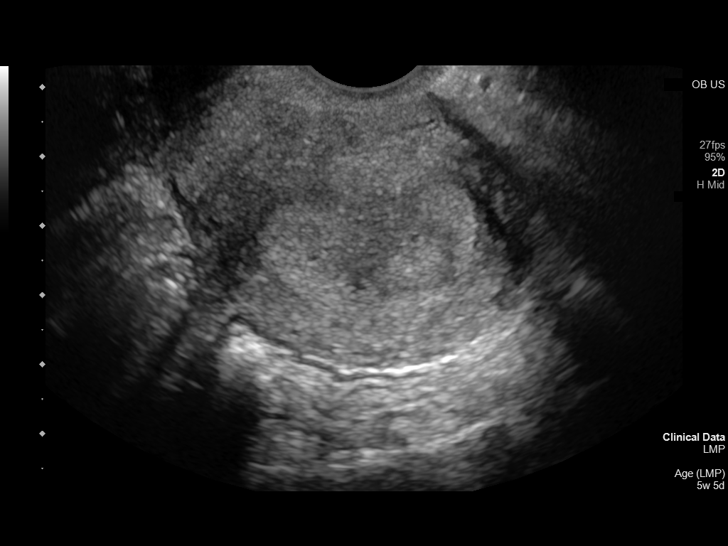
[im 39/95]
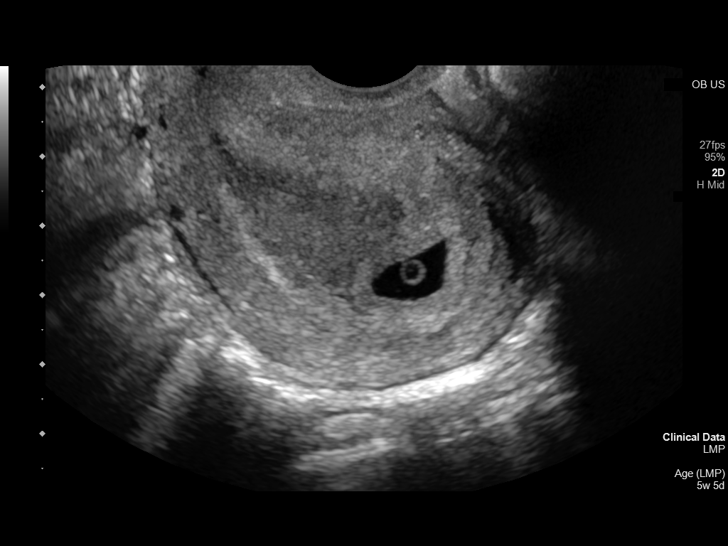
[im 49/95]
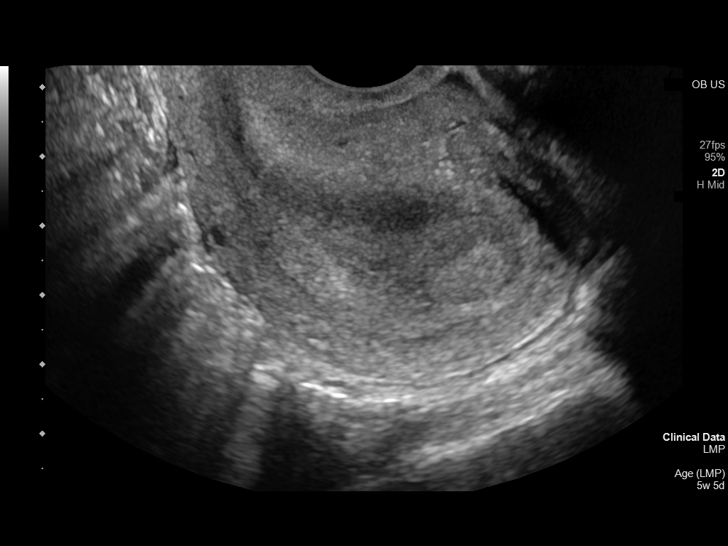
[im 56/95]
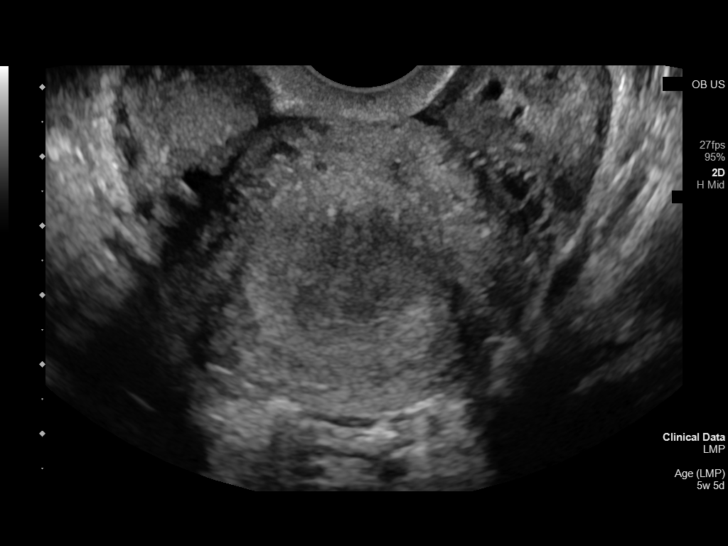
[im 63/95]
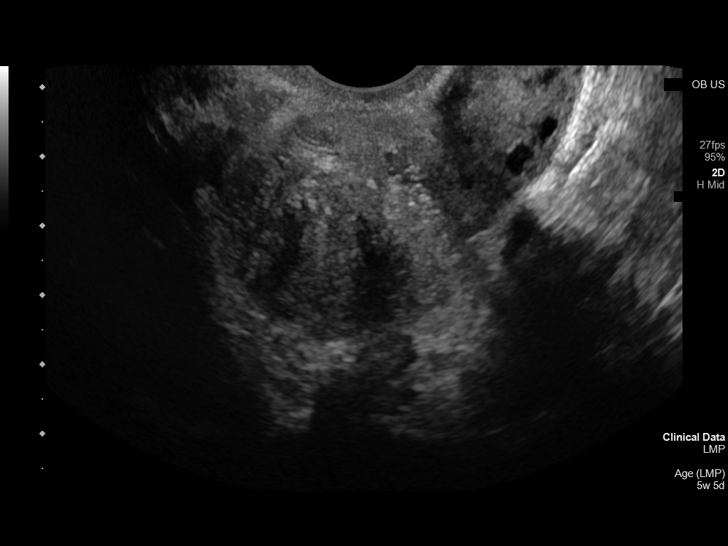
[im 70/95]
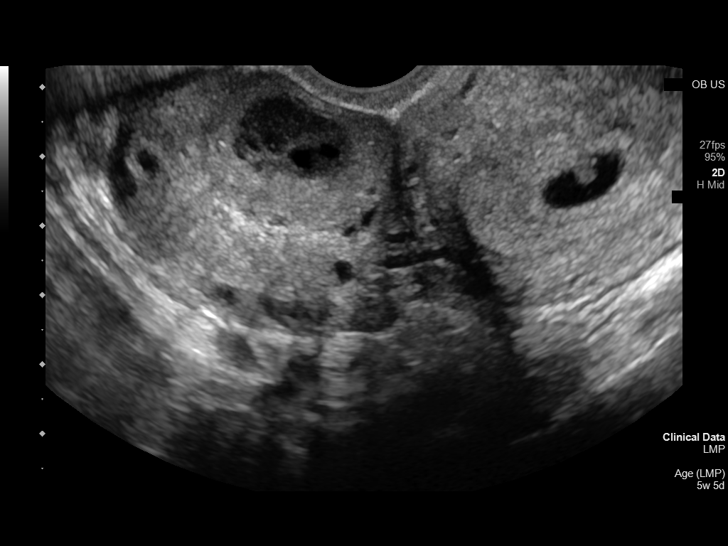
[im 77/95]
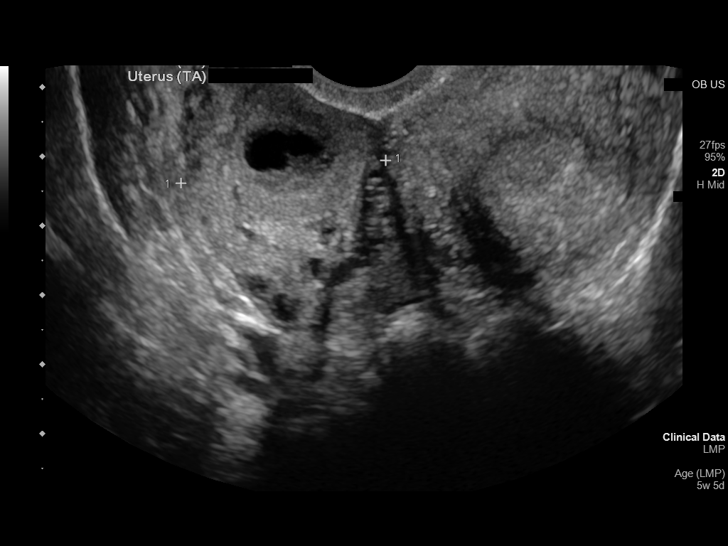
[im 84/95]
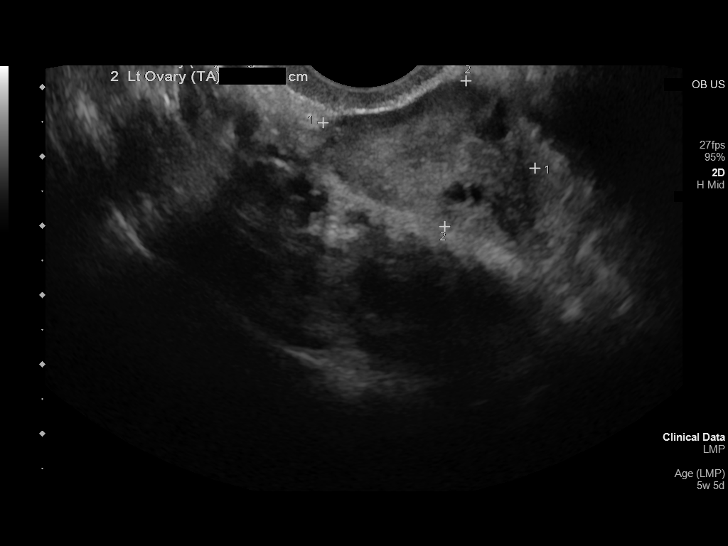
[im 91/95]
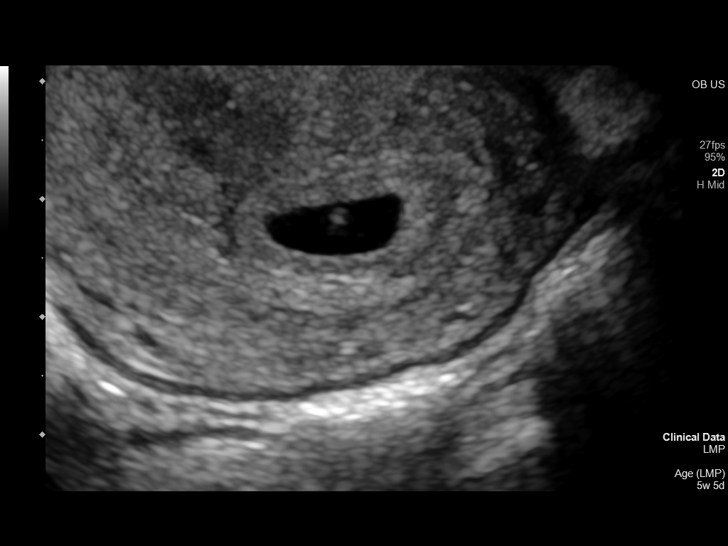

[13 of 28 positions shown; findings below may reference images not displayed]

FINDINGS: Intrauterine gestational sac: Single

Yolk sac:  Present

Embryo:  Present

Cardiac Activity: Negative.

Heart Rate: N/A

CRL:   2.3 mm   5 w 5 d                  US EDC: 04/10/2022

Subchorionic hemorrhage:  None visualized.

Maternal uterus/adnexae: Left ovary unremarkable and normal in
appearance. 1.6 cm complex cyst within the right ovary, most likely
a hemorrhagic cyst and/or degenerating corpus luteal cyst. No other
adnexal mass. Trace free fluid seen within the pelvis.

Pulsed Doppler evaluation of both ovaries demonstrates normal
appearing low-resistance arterial and venous waveforms.
IMPRESSION: 1. Single IUP with internal yolk sac and embryo, crown-rump length
measures 2.3 mm, but no detectable cardiac activity. Findings are
suspicious but not yet definitive for failed pregnancy. Recommend
follow-up US in 10-14 days for definitive diagnosis. This
recommendation follows SRU consensus guidelines: Diagnostic Criteria
for Nonviable Pregnancy Early in the First Trimester. N Engl J Med
5930; [DATE].
2. 1.6 cm complex right ovarian cyst, likely a small hemorrhagic
cyst or degenerating corpus luteal cyst. Associated trace free fluid
within the pelvis.
3. No other acute abnormality within the pelvis. No evidence for
ovarian torsion.

## 2022-12-28 ENCOUNTER — Other Ambulatory Visit: Payer: Self-pay

## 2022-12-28 ENCOUNTER — Emergency Department
Admission: EM | Admit: 2022-12-28 | Discharge: 2022-12-29 | Disposition: A | Discharge status: Home / Self Care | Payer: Medicaid Other | Attending: Emergency Medicine | Admitting: Emergency Medicine

## 2022-12-28 DIAGNOSIS — Z91010 Allergy to peanuts: Secondary | ICD-10-CM | POA: Diagnosis not present

## 2022-12-28 DIAGNOSIS — L03211 Cellulitis of face: Secondary | ICD-10-CM | POA: Diagnosis not present

## 2022-12-28 DIAGNOSIS — L0201 Cutaneous abscess of face: Secondary | ICD-10-CM | POA: Diagnosis not present

## 2022-12-28 NOTE — ED Triage Notes (Signed)
Pt to ED via POV c/o abscess to right eyebrow. Pt with swelling to that area and pain all in right side. Was draining yesterday. Hx of same last year.

## 2022-12-29 ENCOUNTER — Emergency Department: Payer: Medicaid Other

## 2022-12-29 LAB — CBC WITH DIFFERENTIAL/PLATELET
Abs Immature Granulocytes: 0.03 10*3/uL (ref 0.00–0.07)
Basophils Absolute: 0.1 10*3/uL (ref 0.0–0.1)
Basophils Relative: 1 %
Eosinophils Absolute: 0.1 10*3/uL (ref 0.0–0.5)
Eosinophils Relative: 1 %
HCT: 36.4 % (ref 36.0–46.0)
Hemoglobin: 10.6 g/dL — ABNORMAL LOW (ref 12.0–15.0)
Immature Granulocytes: 0 %
Lymphocytes Relative: 37 %
Lymphs Abs: 2.8 10*3/uL (ref 0.7–4.0)
MCH: 21.2 pg — ABNORMAL LOW (ref 26.0–34.0)
MCHC: 29.1 g/dL — ABNORMAL LOW (ref 30.0–36.0)
MCV: 72.7 fL — ABNORMAL LOW (ref 80.0–100.0)
Monocytes Absolute: 0.6 10*3/uL (ref 0.1–1.0)
Monocytes Relative: 7 %
Neutro Abs: 4.2 10*3/uL (ref 1.7–7.7)
Neutrophils Relative %: 54 %
Platelets: 364 10*3/uL (ref 150–400)
RBC: 5.01 MIL/uL (ref 3.87–5.11)
RDW: 15.4 % (ref 11.5–15.5)
WBC: 7.7 10*3/uL (ref 4.0–10.5)
nRBC: 0 % (ref 0.0–0.2)

## 2022-12-29 LAB — BASIC METABOLIC PANEL
Anion gap: 7 (ref 5–15)
BUN: 12 mg/dL (ref 6–20)
CO2: 24 mmol/L (ref 22–32)
Calcium: 8.8 mg/dL — ABNORMAL LOW (ref 8.9–10.3)
Chloride: 105 mmol/L (ref 98–111)
Creatinine, Ser: 0.89 mg/dL (ref 0.44–1.00)
GFR, Estimated: >60 mL/min (ref >60)
Glucose, Bld: 85 mg/dL (ref 70–99)
Potassium: 3.5 mmol/L (ref 3.5–5.1)
Sodium: 136 mmol/L (ref 135–145)

## 2022-12-29 LAB — POC URINE PREG, ED: Preg Test, Ur: NEGATIVE

## 2022-12-29 MED ORDER — IOHEXOL 300 MG/ML  SOLN
75.0000 mL | Freq: Once | INTRAMUSCULAR | Status: AC | PRN
  Administered 2022-12-29: 75 mL via INTRAVENOUS

## 2022-12-29 MED ORDER — DOXYCYCLINE HYCLATE 100 MG PO TABS
100.0000 mg | ORAL_TABLET | Freq: Once | ORAL | Status: AC
  Administered 2022-12-29: 100 mg via ORAL
  Filled 2022-12-29: qty 1

## 2022-12-29 MED ORDER — MORPHINE SULFATE (PF) 4 MG/ML IV SOLN
4.0000 mg | Freq: Once | INTRAVENOUS | Status: AC
  Administered 2022-12-29: 4 mg via INTRAVENOUS
  Filled 2022-12-29: qty 1

## 2022-12-29 MED ORDER — ONDANSETRON HCL 4 MG/2ML IJ SOLN
4.0000 mg | Freq: Once | INTRAMUSCULAR | Status: AC
  Administered 2022-12-29: 4 mg via INTRAVENOUS
  Filled 2022-12-29: qty 2

## 2022-12-29 MED ORDER — HYDROCODONE-ACETAMINOPHEN 5-325 MG PO TABS
1.0000 | ORAL_TABLET | Freq: Once | ORAL | Status: AC
  Administered 2022-12-29: 1 via ORAL
  Filled 2022-12-29: qty 1

## 2022-12-29 MED ORDER — DOXYCYCLINE HYCLATE 50 MG PO CAPS: 100.0000 mg | ORAL_CAPSULE | Freq: Two times a day (BID) | ORAL | 0 refills | Status: DC

## 2022-12-29 MED ORDER — HYDROCODONE-ACETAMINOPHEN 5-325 MG PO TABS: 1.0000 | ORAL_TABLET | Freq: Four times a day (QID) | ORAL | 0 refills | Status: DC | PRN

## 2022-12-29 MED ORDER — SODIUM CHLORIDE 0.9 % IV SOLN
3.0000 g | Freq: Once | INTRAVENOUS | Status: AC
  Administered 2022-12-29: 3 g via INTRAVENOUS
  Filled 2022-12-29: qty 8

## 2022-12-29 MED ORDER — CEPHALEXIN 500 MG PO CAPS: 500.0000 mg | ORAL_CAPSULE | Freq: Three times a day (TID) | ORAL | 0 refills | Status: DC

## 2022-12-29 MED ORDER — SODIUM CHLORIDE 0.9 % IV BOLUS
1000.0000 mL | Freq: Once | INTRAVENOUS | Status: AC
  Administered 2022-12-29: 1000 mL via INTRAVENOUS

## 2022-12-29 NOTE — Discharge Instructions (Signed)
Take antibiotics as prescribed until finished (Keflex, Doxycycline).  You may take ibuprofen as needed for pain, Norco as needed for more severe pain.  Apply moist heat to affected area several times daily.  Return to the ER for worsening symptoms, persistent vomiting, eye pain or limited movement of right eye, or other concerns.

## 2022-12-29 NOTE — ED Provider Notes (Signed)
[facility] Provider Note    Event Date/Time   First MD Initiated Contact with Patient 12/29/22 0023     (approximate)   History   Abscess   HPI  Brenda Carpenter is a 22 y.o. female who presents to the ED from home with a chief complaint of abscess above right eyebrow which she has noticed for the past several days.  History of similar last year requiring IV antibiotics and I&D.  States it was draining yesterday but sealed up today.  Denies fever/chills, eye pain, headache, nausea/vomiting or dizziness.     Past Medical History  History reviewed. No pertinent past medical history.   Active Problem List   Patient Active Problem List   Diagnosis Date Noted   Headache in pregnancy, antepartum, third trimester 02/16/2022   Back pain affecting pregnancy in second trimester 12/13/2021   Vaginitis due to Trichomonas 12/13/2021   Iron deficiency anemia during pregnancy 12/13/2021   Nausea and vomiting during pregnancy 11/18/2021     Past Surgical History  History reviewed. No pertinent surgical history.   Home Medications   Prior to Admission medications   Medication Sig Start Date End Date Taking? Authorizing Provider  cephALEXin (KEFLEX) 500 MG capsule Take 1 capsule (500 mg total) by mouth 3 (three) times daily. 12/29/22  Yes Irean Hong, MD  doxycycline (VIBRAMYCIN) 50 MG capsule Take 2 capsules (100 mg total) by mouth 2 (two) times daily. 12/29/22  Yes Irean Hong, MD  HYDROcodone-acetaminophen (NORCO) 5-325 MG tablet Take 1 tablet by mouth every 6 (six) hours as needed for moderate pain. 12/29/22  Yes Irean Hong, MD  docusate sodium (COLACE) 100 MG capsule Take 1 tablet once or twice daily as needed for constipation while taking iron supplements Patient not taking: Reported on 12/13/2021 05/20/20   Loleta Rose, MD  ferrous sulfate 325 (65 FE) MG tablet Take 1 tablet (325 mg total) by mouth daily with breakfast. 05/20/20 06/19/20  Loleta Rose, MD   ferrous sulfate 325 (65 FE) MG tablet Take 1 tablet (325 mg total) by mouth daily. 08/13/21 12/11/21  Ward, Layla Maw, DO  Prenatal Vit-Fe Fumarate-FA (MULTIVITAMIN-PRENATAL) 27-0.8 MG TABS tablet Take 1 tablet by mouth daily at 12 noon.    [provider]     Allergies  Peanut-containing drug products and Vancomycin   Family History  History reviewed. No pertinent family history.   Physical Exam  Triage Vital Signs: ED Triage Vitals  Enc Vitals Group     BP 12/28/22 2347 118/75     Pulse Rate 12/28/22 2347 86     Resp 12/28/22 2347 18     Temp 12/28/22 2347 98.6 F (37 C)     Temp Source 12/28/22 2347 Oral     SpO2 12/28/22 2347 100 %     Weight --      Height 12/28/22 2348 5\' 2"  (1.575 m)     Head Circumference --      Peak Flow --      Pain Score 12/28/22 2348 8     Pain Loc --      Pain Edu? --      Excl. in GC? --     Updated Vital Signs: BP 114/67   Pulse 77   Temp 98.6 F (37 C) (Oral)   Resp 16   Ht 5\' 2"  (1.575 m)   LMP  (LMP Unknown)   SpO2 98%   BMI 27.62 kg/m  General: Awake, mild distress.  CV:  RRR.  Good peripheral perfusion.  Resp:  Normal effort.  CTAB. Abd:  Nontender.  No distention.  Other:  Quarter sized area of induration above right eyebrow with central scab.  No purulence expressed.  No fluctuance.  PERRL.  EOMI.   ED Results / Procedures / Treatments  Labs (all labs ordered are listed, but only abnormal results are displayed) Labs Reviewed  CBC WITH DIFFERENTIAL/PLATELET - Abnormal; Notable for the following components:      Result Value   Hemoglobin 10.6 (*)    MCV 72.7 (*)    MCH 21.2 (*)    MCHC 29.1 (*)    All other components within normal limits  BASIC METABOLIC PANEL - Abnormal; Notable for the following components:   Calcium 8.8 (*)    All other components within normal limits  POC URINE PREG, ED     EKG  None   RADIOLOGY I have independently visualized and interpreted patient CT scan as well as  noted the radiology interpretation:  CT maxillofacial: Scalp soft tissue edema without abscess or fluid collection  Official radiology report(s): CT Maxillofacial W Contrast  Result Date: 12/29/2022 CLINICAL DATA:  Right periorbital abscess EXAM: CT MAXILLOFACIAL WITH CONTRAST TECHNIQUE: Multidetector CT imaging of the maxillofacial structures was performed with intravenous contrast. Multiplanar CT image reconstructions were also generated. RADIATION DOSE REDUCTION: This exam was performed according to the departmental dose-optimization program which includes automated exposure control, adjustment of the mA and/or kV according to patient size and/or use of iterative reconstruction technique. CONTRAST:  75mL OMNIPAQUE IOHEXOL 300 MG/ML  SOLN COMPARISON:  None Available. FINDINGS: Osseous: No fracture or mandibular dislocation. No destructive process. Orbits: Negative. No traumatic or inflammatory finding. Sinuses: Clear. Soft tissues: There is right supraorbital scalp soft tissue edema without abscess or fluid collection. Limited intracranial: Normal IMPRESSION: Right supraorbital scalp soft tissue edema without abscess or fluid collection. Electronically Signed   By: Deatra Robinson M.D.   On: 12/29/2022 02:54     PROCEDURES:  Critical Care performed: No  Procedures   MEDICATIONS ORDERED IN ED: Medications  sodium chloride 0.9 % bolus 1,000 mL (0 mLs Intravenous Stopped 12/29/22 0305)  morphine (PF) 4 MG/ML injection 4 mg (4 mg Intravenous Given 12/29/22 0107)  ondansetron (ZOFRAN) injection 4 mg (4 mg Intravenous Given 12/29/22 0107)  Ampicillin-Sulbactam (UNASYN) 3 g in sodium chloride 0.9 % 100 mL IVPB (0 g Intravenous Stopped 12/29/22 0149)  iohexol (OMNIPAQUE) 300 MG/ML solution 75 mL (75 mLs Intravenous Contrast Given 12/29/22 0214)  doxycycline (VIBRA-TABS) tablet 100 mg (100 mg Oral Given 12/29/22 0320)  HYDROcodone-acetaminophen (NORCO/VICODIN) 5-325 MG per tablet 1 tablet (1 tablet Oral Given  12/29/22 0329)     IMPRESSION / MDM / ASSESSMENT AND PLAN / ED COURSE  I reviewed the triage vital signs and the nursing notes.                             22 year old female presenting with right forehead swelling.  Differential diagnosis includes but is not limited to cellulitis, abscess, sebaceous cyst, etc.  I have personally reviewed patient's records and note an OB office visit from November 2023 for postpartum care.  Patient's presentation is most consistent with acute complicated illness / injury requiring diagnostic workup.  Area in question does not feel fluctuant.  Given its close proximity to patient's eye, will obtain basic lab work, CT maxillofacial.  Administer IV  pain medicine, IV Unasyn.  Will reassess.  Clinical Course as of 12/29/22 0450  Fri Dec 29, 2022  1610 CT demonstrates soft tissue swelling without abscess or fluid collection.  Patient feeling better.  Will discharge home on double coverage antibiotics and patient will follow closely with ENT.  Strict return precautions given.  Patient and family member verbalized understanding and agree with plan of care. [JS]    Clinical Course User Index [JS] Irean Hong, MD     FINAL CLINICAL IMPRESSION(S) / ED DIAGNOSES   Final diagnoses:  Facial cellulitis     Rx / DC Orders   ED Discharge Orders          Ordered    cephALEXin (KEFLEX) 500 MG capsule  3 times daily        12/29/22 0303    doxycycline (VIBRAMYCIN) 50 MG capsule  2 times daily        12/29/22 0303    HYDROcodone-acetaminophen (NORCO) 5-325 MG tablet  Every 6 hours PRN        12/29/22 0303             Note:  This document was prepared using Dragon voice recognition software and may include unintentional dictation errors.   Irean Hong, MD 12/29/22 [phone]

## 2023-03-19 ENCOUNTER — Other Ambulatory Visit: Payer: Self-pay

## 2023-03-19 ENCOUNTER — Emergency Department
Admission: EM | Admit: 2023-03-19 | Discharge: 2023-03-19 | Disposition: A | Discharge status: Home / Self Care | Payer: Medicaid Other | Attending: Student in an Organized Health Care Education/Training Program | Admitting: Student in an Organized Health Care Education/Training Program

## 2023-03-19 ENCOUNTER — Emergency Department: Payer: Medicaid Other

## 2023-03-19 DIAGNOSIS — M25561 Pain in right knee: Secondary | ICD-10-CM | POA: Diagnosis present

## 2023-03-19 DIAGNOSIS — X509XXA Other and unspecified overexertion or strenuous movements or postures, initial encounter: Secondary | ICD-10-CM | POA: Diagnosis not present

## 2023-03-19 MED ORDER — ACETAMINOPHEN 325 MG PO TABS
650.0000 mg | ORAL_TABLET | Freq: Once | ORAL | Status: AC
  Administered 2023-03-19: 650 mg via ORAL
  Filled 2023-03-19: qty 2

## 2023-03-19 NOTE — ED Provider Notes (Signed)
[facility] Provider Note    Event Date/Time   First MD Initiated Contact with Patient 03/19/23 2000     (approximate)   History   Knee Pain   HPI  Brenda Carpenter is a 22 y.o. female who presents to the ER for evaluation of acute right knee pain that occurred while she was putting her daughter in the car site felt sudden pain and popping sensation in the medial aspect of the right knee.  Able to bear weight but with some discomfort.  Denies any numbness or tingling no other associated injury.     Physical Exam   Triage Vital Signs: ED Triage Vitals  Encounter Vitals Group     BP 03/19/23 1623 109/70     Systolic BP Percentile --      Diastolic BP Percentile --      Pulse Rate 03/19/23 1623 80     Resp 03/19/23 1623 18     Temp 03/19/23 1623 98.4 F (36.9 C)     Temp src --      SpO2 03/19/23 1623 100 %     Weight --      Height --      Head Circumference --      Peak Flow --      Pain Score 03/19/23 1622 8     Pain Loc --      Pain Education --      Exclude from Growth Chart --     Most recent vital signs: Vitals:   03/19/23 1623 03/19/23 2001  BP: 109/70 (!) 118/51  Pulse: 80 (!) 55  Resp: 18 16  Temp: 98.4 F (36.9 C)   SpO2: 100% 100%     Constitutional: Alert  Eyes: Conjunctivae are normal.  Head: Atraumatic. Nose: No congestion/rhinnorhea. Mouth/Throat: Mucous membranes are moist.   Neck: Painless ROM.  Cardiovascular:   Good peripheral circulation. Respiratory: Normal respiratory effort.  No retractions.  Gastrointestinal: Soft and nontender.  Musculoskeletal: Trace swelling to the medial aspect of the right knee.  Pain with valgus stress.  No patellar instability.  Able to keep leg raised against gravity. Neurologic:  MAE spontaneously. No gross focal neurologic deficits are appreciated.  Skin:  Skin is warm, dry and intact. No rash noted. Psychiatric: Mood and affect are normal. Speech and behavior are  normal.    ED Results / Procedures / Treatments   Labs (all labs ordered are listed, but only abnormal results are displayed) Labs Reviewed - No data to display   EKG     RADIOLOGY Please see ED Course for my review and interpretation.  I personally reviewed all radiographic images ordered to evaluate for the above acute complaints and reviewed radiology reports and findings.  These findings were personally discussed with the patient.  Please see medical record for radiology report.    PROCEDURES:  Critical Care performed:   Procedures   MEDICATIONS ORDERED IN ED: Medications  acetaminophen (TYLENOL) tablet 650 mg (has no administration in time range)     IMPRESSION / MDM / ASSESSMENT AND PLAN / ED COURSE  I reviewed the triage vital signs and the nursing notes.                              Differential diagnosis includes, but is not limited to, fracture, contusion, ligamentous injury, dislocation  Pt here with acute right knee injury. Denies any other injuries. Denies  motor or sensory loss. Able to bear weight. VSS in ED. Exam as above. NV intact throughout and distal to injury. Pt able to range joint. No clinical suspicion for infectious process or septic joint. X-rays w/o fracture. No other injuries reported or noted on exam. Presentation c/w sprain /ligamentous injury.   Discussed supportive care and follow up with pt.        FINAL CLINICAL IMPRESSION(S) / ED DIAGNOSES   Final diagnoses:  Acute pain of right knee     Rx / DC Orders   ED Discharge Orders     None        Note:  This document was prepared using Dragon voice recognition software and may include unintentional dictation errors.    Willy Eddy, MD 03/19/23 2013

## 2023-03-19 NOTE — ED Triage Notes (Signed)
Pt comes iwht c/o right knee pain. Pt states she was leaning against car and heard a pop.

## 2023-06-19 ENCOUNTER — Ambulatory Visit: Payer: Self-pay

## 2023-07-28 ENCOUNTER — Other Ambulatory Visit: Payer: Self-pay

## 2023-07-28 ENCOUNTER — Emergency Department: Payer: Medicaid Other

## 2023-07-28 ENCOUNTER — Observation Stay
Admission: EM | Admit: 2023-07-28 | Discharge: 2023-07-29 | Disposition: A | Discharge status: Home / Self Care | Payer: Medicaid Other | Attending: Emergency Medicine | Admitting: Emergency Medicine

## 2023-07-28 DIAGNOSIS — R0602 Shortness of breath: Secondary | ICD-10-CM | POA: Diagnosis present

## 2023-07-28 DIAGNOSIS — J101 Influenza due to other identified influenza virus with other respiratory manifestations: Secondary | ICD-10-CM | POA: Insufficient documentation

## 2023-07-28 DIAGNOSIS — N760 Acute vaginitis: Secondary | ICD-10-CM | POA: Insufficient documentation

## 2023-07-28 DIAGNOSIS — D649 Anemia, unspecified: Secondary | ICD-10-CM | POA: Diagnosis present

## 2023-07-28 DIAGNOSIS — B9689 Other specified bacterial agents as the cause of diseases classified elsewhere: Secondary | ICD-10-CM | POA: Insufficient documentation

## 2023-07-28 DIAGNOSIS — Z20822 Contact with and (suspected) exposure to covid-19: Secondary | ICD-10-CM | POA: Insufficient documentation

## 2023-07-28 DIAGNOSIS — J111 Influenza due to unidentified influenza virus with other respiratory manifestations: Secondary | ICD-10-CM | POA: Diagnosis not present

## 2023-07-28 DIAGNOSIS — D509 Iron deficiency anemia, unspecified: Secondary | ICD-10-CM | POA: Diagnosis not present

## 2023-07-28 HISTORY — DX: Anemia, unspecified: D64.9

## 2023-07-28 LAB — URINALYSIS, ROUTINE W REFLEX MICROSCOPIC
Bilirubin Urine: NEGATIVE
Glucose, UA: NEGATIVE mg/dL
Hgb urine dipstick: NEGATIVE
Ketones, ur: 5 mg/dL — AB
Leukocytes,Ua: NEGATIVE
Nitrite: NEGATIVE
Protein, ur: 30 mg/dL — AB
Specific Gravity, Urine: 1.029 (ref 1.005–1.030)
pH: 6 (ref 5.0–8.0)

## 2023-07-28 LAB — HEPATIC FUNCTION PANEL
ALT: 16 U/L (ref 0–44)
AST: 21 U/L (ref 15–41)
Albumin: 3.6 g/dL (ref 3.5–5.0)
Alkaline Phosphatase: 49 U/L (ref 38–126)
Bilirubin, Direct: 0.1 mg/dL (ref 0.0–0.2)
Indirect Bilirubin: 0.5 mg/dL (ref 0.3–0.9)
Total Bilirubin: 0.6 mg/dL (ref 0.0–1.2)
Total Protein: 7.7 g/dL (ref 6.5–8.1)

## 2023-07-28 LAB — CHLAMYDIA/NGC RT PCR (ARMC ONLY)
Chlamydia Tr: NOT DETECTED
N gonorrhoeae: NOT DETECTED

## 2023-07-28 LAB — WET PREP, GENITAL
Sperm: NONE SEEN
Trich, Wet Prep: NONE SEEN
WBC, Wet Prep HPF POC: >=10 — AB (ref <10)
Yeast Wet Prep HPF POC: NONE SEEN

## 2023-07-28 LAB — BASIC METABOLIC PANEL
Anion gap: 10 (ref 5–15)
BUN: 10 mg/dL (ref 6–20)
CO2: 23 mmol/L (ref 22–32)
Calcium: 8.7 mg/dL — ABNORMAL LOW (ref 8.9–10.3)
Chloride: 102 mmol/L (ref 98–111)
Creatinine, Ser: 0.73 mg/dL (ref 0.44–1.00)
GFR, Estimated: >60 mL/min (ref >60)
Glucose, Bld: 96 mg/dL (ref 70–99)
Potassium: 3.4 mmol/L — ABNORMAL LOW (ref 3.5–5.1)
Sodium: 135 mmol/L (ref 135–145)

## 2023-07-28 LAB — CBC
HCT: 26.3 % — ABNORMAL LOW (ref 36.0–46.0)
Hemoglobin: 6.7 g/dL — ABNORMAL LOW (ref 12.0–15.0)
MCH: 16.1 pg — ABNORMAL LOW (ref 26.0–34.0)
MCHC: 25.5 g/dL — ABNORMAL LOW (ref 30.0–36.0)
MCV: 63.1 fL — ABNORMAL LOW (ref 80.0–100.0)
Platelets: 269 10*3/uL (ref 150–400)
RBC: 4.17 MIL/uL (ref 3.87–5.11)
RDW: 19.7 % — ABNORMAL HIGH (ref 11.5–15.5)
WBC: 7.5 10*3/uL (ref 4.0–10.5)
nRBC: 0 % (ref 0.0–0.2)

## 2023-07-28 LAB — RESP PANEL BY RT-PCR (RSV, FLU A&B, COVID)  RVPGX2
Influenza A by PCR: POSITIVE — AB
Influenza B by PCR: NEGATIVE
Resp Syncytial Virus by PCR: NEGATIVE
SARS Coronavirus 2 by RT PCR: NEGATIVE

## 2023-07-28 LAB — TROPONIN I (HIGH SENSITIVITY)
Troponin I (High Sensitivity): <2 ng/L (ref <18)
Troponin I (High Sensitivity): <2 ng/L (ref <18)

## 2023-07-28 LAB — IRON AND TIBC
Iron: 15 ug/dL — ABNORMAL LOW (ref 28–170)
Saturation Ratios: 4 % — ABNORMAL LOW (ref 10.4–31.8)
TIBC: 403 ug/dL (ref 250–450)
UIBC: 388 ug/dL

## 2023-07-28 LAB — POC URINE PREG, ED: Preg Test, Ur: NEGATIVE

## 2023-07-28 LAB — LACTIC ACID, PLASMA
Lactic Acid, Venous: 0.9 mmol/L (ref 0.5–1.9)
Lactic Acid, Venous: 1 mmol/L (ref 0.5–1.9)

## 2023-07-28 LAB — HEMOGLOBIN AND HEMATOCRIT, BLOOD
HCT: 28.4 % — ABNORMAL LOW (ref 36.0–46.0)
Hemoglobin: 8 g/dL — ABNORMAL LOW (ref 12.0–15.0)

## 2023-07-28 LAB — FOLATE: Folate: 12.7 ng/mL (ref >5.9)

## 2023-07-28 LAB — RETICULOCYTES
Immature Retic Fract: 27.1 % — ABNORMAL HIGH (ref 2.3–15.9)
RBC.: 3.46 MIL/uL — ABNORMAL LOW (ref 3.87–5.11)
Retic Count, Absolute: 50.9 10*3/uL (ref 19.0–186.0)
Retic Ct Pct: 1.5 % (ref 0.4–3.1)

## 2023-07-28 LAB — VITAMIN B12: Vitamin B-12: 310 pg/mL (ref 180–914)

## 2023-07-28 LAB — LIPASE, BLOOD: Lipase: 43 U/L (ref 11–51)

## 2023-07-28 LAB — FERRITIN: Ferritin: 4 ng/mL — ABNORMAL LOW (ref 11–307)

## 2023-07-28 LAB — PREPARE RBC (CROSSMATCH)

## 2023-07-28 MED ORDER — LACTATED RINGERS IV SOLN
INTRAVENOUS | Status: AC
Start: 2023-07-29 — End: 2023-07-29

## 2023-07-28 MED ORDER — METRONIDAZOLE 500 MG PO TABS
500.0000 mg | ORAL_TABLET | Freq: Two times a day (BID) | ORAL | Status: DC
  Administered 2023-07-28 – 2023-07-29 (×2): 500 mg via ORAL
  Filled 2023-07-28 (×2): qty 1

## 2023-07-28 MED ORDER — ACETAMINOPHEN 325 MG RE SUPP: 650.0000 mg | Freq: Four times a day (QID) | RECTAL | Status: DC | PRN

## 2023-07-28 MED ORDER — ACETAMINOPHEN 500 MG PO TABS
1000.0000 mg | ORAL_TABLET | Freq: Once | ORAL | Status: AC
  Administered 2023-07-28: 1000 mg via ORAL
  Filled 2023-07-28: qty 2

## 2023-07-28 MED ORDER — ACETAMINOPHEN 325 MG PO TABS: 650.0000 mg | ORAL_TABLET | Freq: Four times a day (QID) | ORAL | Status: DC | PRN

## 2023-07-28 MED ORDER — SODIUM CHLORIDE 0.9% IV SOLUTION: Freq: Once | INTRAVENOUS | Status: AC

## 2023-07-28 MED ORDER — ALBUTEROL SULFATE (2.5 MG/3ML) 0.083% IN NEBU: 2.5000 mg | INHALATION_SOLUTION | RESPIRATORY_TRACT | Status: DC | PRN

## 2023-07-28 MED ORDER — OSELTAMIVIR PHOSPHATE 75 MG PO CAPS
75.0000 mg | ORAL_CAPSULE | Freq: Two times a day (BID) | ORAL | Status: DC
  Administered 2023-07-28 – 2023-07-29 (×2): 75 mg via ORAL
  Filled 2023-07-28 (×3): qty 1

## 2023-07-28 NOTE — ED Notes (Addendum)
Blood consent signed on paper in medical records.

## 2023-07-28 NOTE — ED Provider Notes (Signed)
[facility] Provider Note    Event Date/Time   First MD Initiated Contact with Patient 07/28/23 1708     (approximate)   History   Chest Pain and Shortness of Breath   HPI  Brenda Carpenter is a 23 y.o. female who was seen on 07/24/2023 for cough and started on Augmentin for 10 days who comes in with continued chest pain and shortness of breath.  Patient reports that she was diagnosed with iron deficient anemia when she was pregnant.  She was getting iron transfusions.  This was about a year ago.  She stopped taking iron and had her baby.  She is not really thought too much of it but she started feeling recently that she had low blood levels.  She denies any heavy vaginal bleeding rectal bleeding or other concerns.  She states that she did have a fever 4 days ago was tested for COVID and flu that were negative.  They diagnosed her with bronchitis and started on Augmentin.  She states that she has not filled this not taken it.  She states that she started feel better on her own with then started feeling sick again on Friday.  She reports just feeling really weak feeling when she is going to pass out.  She denies any sore throat   Physical Exam   Triage Vital Signs: ED Triage Vitals  Encounter Vitals Group     BP 07/28/23 1431 108/70     Systolic BP Percentile --      Diastolic BP Percentile --      Pulse Rate 07/28/23 1431 (!) 111     Resp 07/28/23 1431 20     Temp 07/28/23 1431 (!) 101.8 F (38.8 C)     Temp Source 07/28/23 1443 Oral     SpO2 07/28/23 1431 100 %     Weight --      Height --      Head Circumference --      Peak Flow --      Pain Score 07/28/23 1441 8     Pain Loc --      Pain Education --      Exclude from Growth Chart --     Most recent vital signs: Vitals:   07/28/23 1431  BP: 108/70  Pulse: (!) 111  Resp: 20  Temp: (!) 101.8 F (38.8 C)  SpO2: 100%     General: Awake, no distress.  CV:  Good peripheral perfusion.   Resp:  Normal effort.  Abd:  No distention.  Soft nontender Other:     ED Results / Procedures / Treatments   Labs (all labs ordered are listed, but only abnormal results are displayed) Labs Reviewed  BASIC METABOLIC PANEL - Abnormal; Notable for the following components:      Result Value   Potassium 3.4 (*)    Calcium 8.7 (*)    All other components within normal limits  CBC - Abnormal; Notable for the following components:   Hemoglobin 6.7 (*)    HCT 26.3 (*)    MCV 63.1 (*)    MCH 16.1 (*)    MCHC 25.5 (*)    RDW 19.7 (*)    All other components within normal limits  RESP PANEL BY RT-PCR (RSV, FLU A&B, COVID)  [id number]  LACTIC ACID, PLASMA  LACTIC ACID, PLASMA  POC URINE PREG, ED  TROPONIN I (HIGH SENSITIVITY)  TROPONIN I (HIGH SENSITIVITY)     EKG  My interpretation of EKG:  Sinus tachycardia rate of 109 without any ST elevation or T wave inversions, normal intervals  RADIOLOGY I have reviewed the xray personally and interpreted and no pneumonia   PROCEDURES:  Critical Care performed: Yes, see critical care procedure note(s)  .1-3 Lead EKG Interpretation  Performed by: Concha Se, MD Authorized by: Concha Se, MD     Interpretation: abnormal     ECG rate:  110   ECG rate assessment: tachycardic     Rhythm: sinus tachycardia     Ectopy: none     Conduction: normal   .Critical Care  Performed by: Concha Se, MD Authorized by: Concha Se, MD   Critical care provider statement:    Critical care time (minutes):  30   Critical care was necessary to treat or prevent imminent or life-threatening deterioration of the following conditions: anemia.   Critical care was time spent personally by me on the following activities:  Development of treatment plan with patient or surrogate, discussions with consultants, evaluation of patient's response to treatment, examination of patient, ordering and review of laboratory studies, ordering and review of  radiographic studies, ordering and performing treatments and interventions, pulse oximetry, re-evaluation of patient's condition and review of old charts    MEDICATIONS ORDERED IN ED: Medications  acetaminophen (TYLENOL) tablet 1,000 mg (has no administration in time range)  0.9 %  sodium chloride infusion (Manually program via Guardrails IV Fluids) (has no administration in time range)     IMPRESSION / MDM / ASSESSMENT AND PLAN / ED COURSE  I reviewed the triage vital signs and the nursing notes.   Patient's presentation is most consistent with acute presentation with potential threat to life or bodily function.   Patient comes in with shortness of breath noted to have fever as well.  Differential includes myocarditis, pneumonia, COVID, flu, UTI.  She denies any symptoms for STDs but did just recently have STD testing done to ensure clearance given she did have gonorrhea back in December.  Will repeat STD testing to sure it is not PID but seems less likely.  Abdomen is soft and nontender low suspicion for acute abdominal process such as appendicitis.  She denies any IV drug so bacteremia seems less likely.  BMP normal CBC shows hemoglobin of 6.7.  Troponin is negative lactate normal  Pregnancy test was negative  Discussed with patient with doing a transfusion and sending patient home but patient reports concerns that she had multiple episodes of near syncopal in conjunction with the anemia and the flu she would prefer admission.  I will discuss with the hospitalist for admission I have also discussed with the oncologist to they can see her and help see if she gets some iron transfusions in the hospital.   The patient is on the cardiac monitor to evaluate for evidence of arrhythmia and/or significant heart rate changes.      FINAL CLINICAL IMPRESSION(S) / ED DIAGNOSES   Final diagnoses:  Iron deficiency anemia, unspecified iron deficiency anemia type  Symptomatic anemia  Influenza      Rx / DC Orders   ED Discharge Orders     None        Note:  This document was prepared using Dragon voice recognition software and may include unintentional dictation errors.   Concha Se, MD 07/28/23 [phone]

## 2023-07-28 NOTE — ED Provider Triage Note (Signed)
Emergency Medicine Provider Triage Evaluation Note  Brenda Carpenter , a 23 y.o. female  was evaluated in triage.  Pt complains of chest pain, SOB, fever that began today. +flu exposure. Worried about her HgB because she has required transfusions in the past.  Review of Systems  Positive: Cp/sob, fever Negative: Abd pain, n/v  Physical Exam  BP 108/70   Pulse (!) 111   Temp (!) 101.8 F (38.8 C)   Resp 20   LMP 07/10/2023 (Approximate)   SpO2 100%  Gen:   Awake, no distress   Resp:  Normal effort  MSK:   Moves extremities without difficulty  Other:    Medical Decision Making  Medically screening exam initiated at 3:07 PM.  Appropriate orders placed.  Daryana Whirley was informed that the remainder of the evaluation will be completed by another provider, this initial triage assessment does not replace that evaluation, and the importance of remaining in the ED until their evaluation is complete.     Jackelyn Hoehn, PA-C 07/28/23 [phone]

## 2023-07-28 NOTE — ED Notes (Signed)
Pt states she was swabbed for covid flu and rsv this week at [facility] and refuses swab at this time.

## 2023-07-28 NOTE — ED Notes (Signed)
Per xray tech- Pt threw up after xray exam and c/o chest heaviness, acuity increased accordingly.

## 2023-07-28 NOTE — ED Triage Notes (Signed)
Pt c/o CP and SHOB that started this morning. Pt is AOX4, appears anxious. Pt states she's been hospitalized for low hgb in the past w/ history of blood and iron infusions. Pt denies bleeding or dark stools. Pt reports headache, body aches, and exposure to flu.

## 2023-07-28 NOTE — H&P (Addendum)
[facility] H&P   Patient Demographics:    Brenda Carpenter, is a 23 y.o. female  MRN: [id number]   DOB - Sep 08, 2000  Admit Date - 07/28/2023  Outpatient Primary MD for the patient is [facility]  Referring MD/NP/PA: Dr Fuller Plan  Patient coming from: Home  Chief Complaint  Patient presents with   Chest Pain   Shortness of Breath      HPI:    Brenda Carpenter  is a 23 y.o. 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  She reports heavy bleeding, average.  7 days every 3 weeks, she has been using both tampons and pads multiple per days.  As well she does report her nephew was recently diagnosed with flu, and her young daughter had fever as well. -In ED she was febrile 101.9, tachycardic at 111, low hemoglobin at 6.7, with MCV of 63, potassium at 3.4, she was positive for influenza A,  wet prep significant for clue cells, given she is symptomatic with anemia, she was started on 1 unit PRBC transfusion and [facility] hospitalist consulted to admit.    Review of systems:      A full 10 point Review of Systems was done, except as stated above, all other Review of Systems were negative.   With Past History of the following :    Past Medical History:  Diagnosis Date   Anemia       History reviewed. No pertinent surgical history.    Social History:     Social History   Tobacco Use   Smoking status: Never   Smokeless tobacco: Never  Substance Use  Topics   Alcohol use: No      Family History :   Family history was reviewed, nonpertinent   Home Medications:   Prior to Admission medications   Medication Sig Start Date End Date Taking? Authorizing Provider  cephALEXin (KEFLEX) 500 MG capsule Take 1 capsule (500 mg total) by mouth 3 (three) times daily. 12/29/22   Irean Hong, MD  docusate sodium (COLACE) 100 MG capsule Take 1 tablet once or twice daily as needed for constipation while taking iron supplements Patient not taking: Reported on 12/13/2021 05/20/20   Loleta Rose, MD  doxycycline (VIBRAMYCIN) 50 MG capsule Take 2 capsules (100 mg total) by mouth 2 (two) times daily. 12/29/22   Irean Hong, MD  ferrous sulfate 325 (65 FE) MG tablet Take 1 tablet (325 mg total) by mouth daily with  breakfast. 05/20/20 06/19/20  Loleta Rose, MD  ferrous sulfate 325 (65 FE) MG tablet Take 1 tablet (325 mg total) by mouth daily. 08/13/21 12/11/21  Ward, Layla Maw, DO  HYDROcodone-acetaminophen (NORCO) 5-325 MG tablet Take 1 tablet by mouth every 6 (six) hours as needed for moderate pain. 12/29/22   Irean Hong, MD  Prenatal Vit-Fe Fumarate-FA (MULTIVITAMIN-PRENATAL) 27-0.8 MG TABS tablet Take 1 tablet by mouth daily at 12 noon.    [provider]     Allergies:     Allergies  Allergen Reactions   Peanut-Containing Drug Products Itching and Swelling   Vancomycin Itching    Patient reported skin itching and burning     Physical Exam:   Vitals  Blood pressure 104/68, pulse 87, temperature 99.2 F (37.3 C), temperature source Oral, resp. rate 18, last menstrual period 07/10/2023, SpO2 100%, unknown if currently breastfeeding.   1. General Female, sitting in bed, in no apparent distress  2. Normal affect and insight, Not Suicidal or Homicidal, Awake Alert, Oriented X 3.  3. No F.N deficits, ALL C.Nerves Intact, Strength 5/5 all 4 extremities, Sensation intact all 4 extremities, Plantars down going.  4. Ears and Eyes appear  Normal, pale conjunctiva  5. Supple Neck, No JVD, No cervical lymphadenopathy appriciated, No Carotid Bruits.  6. Symmetrical Chest wall movement, Good air movement bilaterally, CTAB.  7. RRR, No Gallops, Rubs or Murmurs, No Parasternal Heave.  8. Positive Bowel Sounds, Abdomen Soft, No tenderness, No organomegaly appriciated,No rebound -guarding or rigidity.  9.  No Cyanosis, Normal Skin Turgor, No Skin Rash or Bruise.  10. Good muscle tone,  joints appear normal , no effusions, Normal ROM.     Data Review:    CBC Recent Labs  Lab 07/28/23 1453  WBC 7.5  HGB 6.7*  HCT 26.3*  PLT 269  MCV 63.1*  MCH 16.1*  MCHC 25.5*  RDW 19.7*   ------------------------------------------------------------------------------------------------------------------  Chemistries  Recent Labs  Lab 07/28/23 1453 07/28/23 1730  NA 135  --   K 3.4*  --   CL 102  --   CO2 23  --   GLUCOSE 96  --   BUN 10  --   CREATININE 0.73  --   CALCIUM 8.7*  --   AST  --  21  ALT  --  16  ALKPHOS  --  49  BILITOT  --  0.6   ------------------------------------------------------------------------------------------------------------------ CrCl cannot be calculated (Unknown ideal weight.). ------------------------------------------------------------------------------------------------------------------ No results for input(s): "TSH", "T4TOTAL", "T3FREE", "THYROIDAB" in the last 72 hours.  Invalid input(s): "FREET3"  Coagulation profile No results for input(s): "INR", "PROTIME" in the last 168 hours. ------------------------------------------------------------------------------------------------------------------- No results for input(s): "DDIMER" in the last 72 hours. -------------------------------------------------------------------------------------------------------------------  Cardiac Enzymes No results for input(s): "CKMB", "TROPONINI", "MYOGLOBIN" in the last 168 hours.  Invalid  input(s): "CK" ------------------------------------------------------------------------------------------------------------------ No results found for: "BNP"   ---------------------------------------------------------------------------------------------------------------  Urinalysis    Component Value Date/Time   COLORURINE YELLOW (A) 07/28/2023 1800   APPEARANCEUR HAZY (A) 07/28/2023 1800   LABSPEC 1.029 07/28/2023 1800   PHURINE 6.0 07/28/2023 1800   GLUCOSEU NEGATIVE 07/28/2023 1800   HGBUR NEGATIVE 07/28/2023 1800   BILIRUBINUR NEGATIVE 07/28/2023 1800   KETONESUR 5 (A) 07/28/2023 1800   PROTEINUR 30 (A) 07/28/2023 1800   NITRITE NEGATIVE 07/28/2023 1800   LEUKOCYTESUR NEGATIVE 07/28/2023 1800    ----------------------------------------------------------------------------------------------------------------   Imaging Results:    DG Chest 2 View Result Date: 07/28/2023 CLINICAL DATA:  Chest pain EXAM: CHEST -  2 VIEW COMPARISON:  None Available. FINDINGS: The heart size and mediastinal contours are within normal limits. Both lungs are clear. The visualized skeletal structures are unremarkable. IMPRESSION: No active cardiopulmonary disease. Electronically Signed   By: Signa Kell M.D.   On: 07/28/2023 15:35    EKG:  Vent. 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 -Will check anemia panel including B12 and folic acid to replace if deficient  Influenza A infection -Will start on Tamiflu, as needed Tylenol and albuterol  Chest pain -Musculoskeletal, related to cough, EKG nonacute and troponins are negative  Bacterial vaginosis -Clue cells present in UA, will start on Flagyl 100 mg p.o. twice daily x 1 week  Hypokalemia -Repleted   DVT Prophylaxis SCDs   AM Labs Ordered, also please review Full Orders  Family Communication: Admission, patients condition and plan of care including tests being ordered have been discussed with the patient and and mother at bedside who indicate understanding and agree with the plan and Code Status.  Code Status full code  Likely DC to home  Consults called: None  Admission status: Observation  Time spent in minutes : 55 minutes   Huey Bienenstock M.D on 07/28/2023 at 8:31 PM   [facility] - Office  [phone]

## 2023-07-29 DIAGNOSIS — J101 Influenza due to other identified influenza virus with other respiratory manifestations: Secondary | ICD-10-CM | POA: Diagnosis not present

## 2023-07-29 DIAGNOSIS — B9689 Other specified bacterial agents as the cause of diseases classified elsewhere: Secondary | ICD-10-CM

## 2023-07-29 DIAGNOSIS — N760 Acute vaginitis: Secondary | ICD-10-CM

## 2023-07-29 DIAGNOSIS — D649 Anemia, unspecified: Secondary | ICD-10-CM | POA: Diagnosis not present

## 2023-07-29 DIAGNOSIS — D509 Iron deficiency anemia, unspecified: Secondary | ICD-10-CM | POA: Diagnosis not present

## 2023-07-29 LAB — CBC
HCT: 32.1 % — ABNORMAL LOW (ref 36.0–46.0)
Hemoglobin: 9.1 g/dL — ABNORMAL LOW (ref 12.0–15.0)
MCH: 18.8 pg — ABNORMAL LOW (ref 26.0–34.0)
MCHC: 28.3 g/dL — ABNORMAL LOW (ref 30.0–36.0)
MCV: 66.5 fL — ABNORMAL LOW (ref 80.0–100.0)
Platelets: 233 10*3/uL (ref 150–400)
RBC: 4.83 MIL/uL (ref 3.87–5.11)
RDW: 25.1 % — ABNORMAL HIGH (ref 11.5–15.5)
WBC: 7.1 10*3/uL (ref 4.0–10.5)
nRBC: 0 % (ref 0.0–0.2)

## 2023-07-29 LAB — BASIC METABOLIC PANEL
Anion gap: 9 (ref 5–15)
BUN: 8 mg/dL (ref 6–20)
CO2: 22 mmol/L (ref 22–32)
Calcium: 8.4 mg/dL — ABNORMAL LOW (ref 8.9–10.3)
Chloride: 104 mmol/L (ref 98–111)
Creatinine, Ser: 0.67 mg/dL (ref 0.44–1.00)
GFR, Estimated: >60 mL/min (ref >60)
Glucose, Bld: 91 mg/dL (ref 70–99)
Potassium: 3.8 mmol/L (ref 3.5–5.1)
Sodium: 135 mmol/L (ref 135–145)

## 2023-07-29 LAB — TYPE AND SCREEN
ABO/RH(D): A POS
Antibody Screen: NEGATIVE
Unit division: 0

## 2023-07-29 LAB — BPAM RBC
Blood Product Expiration Date: 202503032359
ISSUE DATE / TIME: 202502011928
Unit Type and Rh: 6200

## 2023-07-29 LAB — HIV ANTIBODY (ROUTINE TESTING W REFLEX): HIV Screen 4th Generation wRfx: NONREACTIVE

## 2023-07-29 MED ORDER — OSELTAMIVIR PHOSPHATE 75 MG PO CAPS: 75.0000 mg | ORAL_CAPSULE | Freq: Two times a day (BID) | ORAL | 0 refills | Status: AC

## 2023-07-29 MED ORDER — METRONIDAZOLE 500 MG PO TABS: 500.0000 mg | ORAL_TABLET | Freq: Two times a day (BID) | ORAL | 0 refills | Status: AC

## 2023-07-29 MED ORDER — IRON SUCROSE 200 MG IVPB - SIMPLE MED
200.0000 mg | Freq: Once | Status: AC
  Administered 2023-07-29: 200 mg via INTRAVENOUS
  Filled 2023-07-29: qty 200

## 2023-07-29 MED ORDER — FERROUS SULFATE 325 (65 FE) MG PO TABS: 325.0000 mg | ORAL_TABLET | Freq: Every day | ORAL | 3 refills | Status: DC

## 2023-07-29 MED ORDER — DM-GUAIFENESIN ER 30-600 MG PO TB12: 1.0000 | ORAL_TABLET | Freq: Two times a day (BID) | ORAL | 0 refills | Status: DC | PRN

## 2023-07-29 NOTE — Hospital Course (Addendum)
Taken from H&P.  Brenda Carpenter  is a 23 y.o. 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 7 days every 3 weeks, nephew recently diagnosed with flu.  On presentation she was febrile at 101.9, tachycardic at 111, hemoglobin was found to be at 6.7, potassium 3.4 and she was tested positive for influenza A. wet prep significant for clue cells,   1 unit of PRBC was ordered and she was started on Tamiflu.  2/2: Vital stable, hemoglobin improved to 9.1, anemia panel with iron deficiency. 1 dose of IV iron was  given.  Minimal upper respiratory symptoms for which she can use supportive care.  She was given a prescription of Flagyl and Tamiflu.  She can continue using her p.o. iron supplement and if need IV replacement need to discuss with primary care provider.  Patient will continue on current medications and need to have a close follow-up with her providers including gynecology for further assistance.

## 2023-07-29 NOTE — Discharge Summary (Signed)
Physician Discharge Summary   Patient: Brenda Carpenter MRN: [id number] DOB: [birth year]  Admit date:     07/28/2023  Discharge date: 07/29/23  Discharge Physician: Arnetha Courser   PCP: [facility]   Recommendations at discharge:  Please obtain CBC and BMP on follow-up Please ensure completion of the course of Flagyl and Tamiflu Follow-up with primary care provider Follow-up with gynecology  Discharge Diagnoses: Principal Problem:   Symptomatic anemia Active Problems:   Iron deficiency anemia   Bacterial vaginosis   Influenza A  Resolved Problems:   * No resolved hospital problems. [facility] Course: Taken from H&P.  Brenda Carpenter  is a 23 y.o. 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 7 days every 3 weeks, nephew recently diagnosed with flu.  On presentation she was febrile at 101.9, tachycardic at 111, hemoglobin was found to be at 6.7, potassium 3.4 and she was tested positive for influenza A. wet prep significant for clue cells,   1 unit of PRBC was ordered and she was started on Tamiflu.  2/2: Vital stable, hemoglobin improved to 9.1, anemia panel with iron deficiency. 1 dose of IV iron was  given.  Minimal upper respiratory symptoms for which she can use supportive care.  She was given a prescription of Flagyl and Tamiflu.  She can continue using her p.o. iron supplement and if need IV replacement need to discuss with primary care provider.  Patient will continue on current medications and need to have a close follow-up with her providers including gynecology for further assistance.    Consultants: None Procedures performed: None Disposition:  Home Diet recommendation:  Discharge Diet Orders (From admission, onward)     Start     Ordered   07/29/23 0000  Diet - low sodium heart healthy        07/29/23 1303           Regular diet DISCHARGE MEDICATION: Allergies as of 07/29/2023       Reactions   Peanut-containing Drug Products Itching, Swelling   Vancomycin Itching   Patient reported skin itching and burning        Medication List     STOP taking these medications    cephALEXin 500 MG capsule Commonly known as: KEFLEX   doxycycline 50 MG capsule Commonly known as: VIBRAMYCIN   HYDROcodone-acetaminophen 5-325 MG tablet Commonly known as: Norco       TAKE these medications    amoxicillin-clavulanate 500-125 MG tablet Commonly known as: AUGMENTIN Take 1 tablet by mouth 2 (two) times daily.   dextromethorphan-guaiFENesin 30-600 MG 12hr tablet Commonly known as: MUCINEX DM Take 1 tablet by mouth 2 (two) times daily as needed for cough.   docusate sodium 100 MG capsule Commonly known as: Colace Take 1 tablet once or twice daily as needed for constipation while taking iron supplements   ferrous sulfate 325 (65 FE) MG tablet Take 1 tablet (325 mg total) by mouth daily. What changed: Another medication with the same name was removed. Continue taking this medication, and follow the directions you see here.   metroNIDAZOLE 500 MG tablet Commonly known as: FLAGYL Take 1 tablet (500 mg total) by mouth every 12 (twelve) hours for 5  days.   multivitamin-prenatal 27-0.8 MG Tabs tablet Take 1 tablet by mouth daily at 12 noon.   oseltamivir 75 MG capsule Commonly known as: TAMIFLU Take 1 capsule (75 mg total) by mouth 2 (two) times daily for 5 days.        Follow-up Information     [facility]. Schedule an appointment as soon as possible for a visit in 1 week(s).   Specialty: General Practice Contact information: [address] [place]  [address] [phone]                Discharge Exam: Ceasar Mons Weights   07/29/23 0910  Weight: 68.5 kg   General.  Well-developed young lady, in no acute distress. Pulmonary.  Lungs clear bilaterally, normal respiratory effort. CV.  Regular rate and rhythm, no JVD, rub or murmur. Abdomen.  Soft, nontender, nondistended, BS positive. CNS.  Alert and oriented .  No focal neurologic deficit. Extremities.  No edema, no cyanosis, pulses intact and symmetrical. Psychiatry.  Judgment and insight appears normal.   Condition at discharge: stable  The results of significant diagnostics from this hospitalization (including imaging, microbiology, ancillary and laboratory) are listed below for reference.   Imaging Studies: DG Chest 2 View Result Date: 07/28/2023 CLINICAL DATA:  Chest pain EXAM: CHEST - 2 VIEW COMPARISON:  None Available. FINDINGS: The heart size and mediastinal contours are within normal limits. Both lungs are clear. The visualized skeletal structures are unremarkable. IMPRESSION: No active cardiopulmonary disease. Electronically Signed   By: Signa Kell M.D.   On: 07/28/2023 15:35    Microbiology: Results for orders placed or performed during the hospital encounter of 07/28/23  Blood culture (routine x 2)     Status: None (Preliminary result)   Collection Time: 07/28/23  5:33 PM   Specimen: BLOOD RIGHT ARM  Result Value Ref Range Status   Specimen Description BLOOD RIGHT ARM  Final   Special Requests   Final    BOTTLES DRAWN AEROBIC AND ANAEROBIC Blood Culture adequate volume   Culture   Final    NO GROWTH < 12 HOURS Performed at [facility], [address] [place], [place] [address]    Report Status PENDING  Incomplete  Blood culture (routine x 2)     Status: None (Preliminary result)   Collection Time: 07/28/23  5:33 PM   Specimen: BLOOD  Result Value Ref Range Status   Specimen Description BLOOD LEFT ANTECUBITAL  Final   Special Requests   Final     BOTTLES DRAWN AEROBIC AND ANAEROBIC Blood Culture results may not be optimal due to an inadequate volume of blood received in culture bottles   Culture   Final    NO GROWTH < 12 HOURS Performed at [facility], [address] [place], [place] [address]    Report Status PENDING  Incomplete  Wet prep, genital     Status: Abnormal   Collection Time: 07/28/23  6:00 PM   Specimen: Anterior Nasal Swab  Result Value Ref Range Status   Yeast Wet Prep HPF POC NONE SEEN NONE SEEN Final   Trich, Wet Prep NONE SEEN NONE SEEN Final   Clue Cells Wet Prep HPF POC PRESENT (A) NONE SEEN Final   WBC, Wet Prep HPF POC >=10 (A) <10 Final   Sperm NONE SEEN  Final    Comment: Performed at [facility], [address], [place], [place] [address]  Chlamydia/NGC rt PCR ([facility] only)  Status: None   Collection Time: 07/28/23  6:00 PM   Specimen: Anterior Nasal Swab  Result Value Ref Range Status   Specimen source GC/Chlam ENDOCERVICAL  Final   Chlamydia Tr NOT DETECTED NOT DETECTED Final   N gonorrhoeae NOT DETECTED NOT DETECTED Final    Comment: (NOTE) This CT/NG assay has not been evaluated in patients with a history of  hysterectomy. Performed at [facility], [address] [place], [place] [address]   Resp panel by RT-PCR (RSV, Flu A&B, Covid) Anterior Nasal Swab     Status: Abnormal   Collection Time: 07/28/23  6:11 PM   Specimen: Anterior Nasal Swab  Result Value Ref Range Status   SARS Coronavirus 2 by RT PCR NEGATIVE NEGATIVE Final    Comment: (NOTE) SARS-CoV-2 target nucleic acids are NOT DETECTED.  The SARS-CoV-2 RNA is generally detectable in upper respiratory specimens during the acute phase of infection. The lowest concentration of SARS-CoV-2 viral copies this assay can detect is 138 copies/mL. A negative result does not preclude SARS-Cov-2 infection and should not be used as the sole basis for treatment or other patient management decisions. A negative  result may occur with  improper specimen collection/handling, submission of specimen other than nasopharyngeal swab, presence of viral mutation(s) within the areas targeted by this assay, and inadequate number of viral copies(<138 copies/mL). A negative result must be combined with clinical observations, patient history, and epidemiological information. The expected result is Negative.  Fact Sheet for Patients:  BloggerCourse.com  Fact Sheet for Healthcare Providers:  SeriousBroker.it  This test is no t yet approved or cleared by the [place] FDA and  has been authorized for detection and/or diagnosis of SARS-CoV-2 by FDA under an Emergency Use Authorization (EUA). This EUA will remain  in effect (meaning this test can be used) for the duration of the COVID-19 declaration under Section 564(b)(1) of the Act, 21 U.S.C.section 360bbb-3(b)(1), unless the authorization is terminated  or revoked sooner.       Influenza A by PCR POSITIVE (A) NEGATIVE Final   Influenza B by PCR NEGATIVE NEGATIVE Final    Comment: (NOTE) The Xpert Xpress SARS-CoV-2/FLU/RSV plus assay is intended as an aid in the diagnosis of influenza from Nasopharyngeal swab specimens and should not be used as a sole basis for treatment. Nasal washings and aspirates are unacceptable for Xpert Xpress SARS-CoV-2/FLU/RSV testing.  Fact Sheet for Patients: BloggerCourse.com  Fact Sheet for Healthcare Providers: SeriousBroker.it  This test is not yet approved or cleared by the [place] FDA and has been authorized for detection and/or diagnosis of SARS-CoV-2 by FDA under an Emergency Use Authorization (EUA). This EUA will remain in effect (meaning this test can be used) for the duration of the COVID-19 declaration under Section 564(b)(1) of the Act, 21 U.S.C. section 360bbb-3(b)(1), unless the authorization is  terminated or revoked.     Resp Syncytial Virus by PCR NEGATIVE NEGATIVE Final    Comment: (NOTE) Fact Sheet for Patients: BloggerCourse.com  Fact Sheet for Healthcare Providers: SeriousBroker.it  This test is not yet approved or cleared by the [place] FDA and has been authorized for detection and/or diagnosis of SARS-CoV-2 by FDA under an Emergency Use Authorization (EUA). This EUA will remain in effect (meaning this test can be used) for the duration of the COVID-19 declaration under Section 564(b)(1) of the Act, 21 U.S.C. section 360bbb-3(b)(1), unless the authorization is terminated or revoked.  Performed at [facility], [address] [place], [place]  [address]     Labs: CBC: Recent Labs  Lab 07/28/23 1453 07/28/23 2317 07/29/23 0329  WBC 7.5  --  7.1  HGB 6.7* 8.0* 9.1*  HCT 26.3* 28.4* 32.1*  MCV 63.1*  --  66.5*  PLT 269  --  233   Basic Metabolic Panel: Recent Labs  Lab 07/28/23 1453 07/29/23 0329  NA 135 135  K 3.4* 3.8  CL 102 104  CO2 23 22  GLUCOSE 96 91  BUN 10 8  CREATININE 0.73 0.67  CALCIUM 8.7* 8.4*   Liver Function Tests: Recent Labs  Lab 07/28/23 1730  AST 21  ALT 16  ALKPHOS 49  BILITOT 0.6  PROT 7.7  ALBUMIN 3.6   CBG: No results for input(s): "GLUCAP" in the last 168 hours.  Discharge time spent: greater than 30 minutes.  This record has been created using Conservation officer, historic buildings. Errors have been sought and corrected,but may not always be located. Such creation errors do not reflect on the standard of care.   Signed: Arnetha Courser, MD [facility] 07/29/2023

## 2023-07-30 ENCOUNTER — Other Ambulatory Visit: Payer: Self-pay

## 2023-07-30 DIAGNOSIS — D649 Anemia, unspecified: Secondary | ICD-10-CM

## 2023-08-02 LAB — CULTURE, BLOOD (ROUTINE X 2)
Culture: NO GROWTH
Culture: NO GROWTH
Special Requests: ADEQUATE

## 2023-08-08 ENCOUNTER — Telehealth: Payer: Self-pay | Admitting: *Deleted

## 2023-08-08 ENCOUNTER — Inpatient Hospital Stay: Payer: Medicaid Other | Admitting: Internal Medicine

## 2023-08-08 NOTE — Telephone Encounter (Signed)
Re: this new pt- Please do not schedule any further appointments with me- as she had no show twice per Dr Donneta Romberg.

## 2023-10-23 ENCOUNTER — Emergency Department
Admission: EM | Admit: 2023-10-23 | Discharge: 2023-10-23 | Disposition: A | Discharge status: Home / Self Care | Attending: Emergency Medicine | Admitting: Emergency Medicine

## 2023-10-23 ENCOUNTER — Other Ambulatory Visit: Payer: Self-pay

## 2023-10-23 ENCOUNTER — Emergency Department

## 2023-10-23 DIAGNOSIS — N939 Abnormal uterine and vaginal bleeding, unspecified: Secondary | ICD-10-CM

## 2023-10-23 DIAGNOSIS — O039 Complete or unspecified spontaneous abortion without complication: Secondary | ICD-10-CM | POA: Insufficient documentation

## 2023-10-23 DIAGNOSIS — O209 Hemorrhage in early pregnancy, unspecified: Secondary | ICD-10-CM | POA: Diagnosis present

## 2023-10-23 DIAGNOSIS — Z3A01 Less than 8 weeks gestation of pregnancy: Secondary | ICD-10-CM | POA: Diagnosis not present

## 2023-10-23 LAB — COMPREHENSIVE METABOLIC PANEL WITH GFR
ALT: 14 U/L (ref 0–44)
AST: 14 U/L — ABNORMAL LOW (ref 15–41)
Albumin: 4 g/dL (ref 3.5–5.0)
Alkaline Phosphatase: 55 U/L (ref 38–126)
Anion gap: 11 (ref 5–15)
BUN: 11 mg/dL (ref 6–20)
CO2: 23 mmol/L (ref 22–32)
Calcium: 9.5 mg/dL (ref 8.9–10.3)
Chloride: 103 mmol/L (ref 98–111)
Creatinine, Ser: 0.62 mg/dL (ref 0.44–1.00)
GFR, Estimated: >60 mL/min (ref >60)
Glucose, Bld: 113 mg/dL — ABNORMAL HIGH (ref 70–99)
Potassium: 3.9 mmol/L (ref 3.5–5.1)
Sodium: 137 mmol/L (ref 135–145)
Total Bilirubin: 0.3 mg/dL (ref 0.0–1.2)
Total Protein: 7.7 g/dL (ref 6.5–8.1)

## 2023-10-23 LAB — TYPE AND SCREEN
ABO/RH(D): A POS
Antibody Screen: NEGATIVE

## 2023-10-23 LAB — HCG, QUANTITATIVE, PREGNANCY: hCG, Beta Chain, Quant, S: 15187 m[IU]/mL — ABNORMAL HIGH (ref <5)

## 2023-10-23 LAB — CBC
HCT: 33.2 % — ABNORMAL LOW (ref 36.0–46.0)
Hemoglobin: 10 g/dL — ABNORMAL LOW (ref 12.0–15.0)
MCH: 21.2 pg — ABNORMAL LOW (ref 26.0–34.0)
MCHC: 30.1 g/dL (ref 30.0–36.0)
MCV: 70.5 fL — ABNORMAL LOW (ref 80.0–100.0)
Platelets: 353 10*3/uL (ref 150–400)
RBC: 4.71 MIL/uL (ref 3.87–5.11)
RDW: 16.6 % — ABNORMAL HIGH (ref 11.5–15.5)
WBC: 6.7 10*3/uL (ref 4.0–10.5)
nRBC: 0 % (ref 0.0–0.2)

## 2023-10-23 MED ORDER — ACETAMINOPHEN 325 MG PO TABS
650.0000 mg | ORAL_TABLET | Freq: Once | ORAL | Status: AC
  Administered 2023-10-23: 650 mg via ORAL
  Filled 2023-10-23: qty 2

## 2023-10-23 NOTE — ED Triage Notes (Signed)
 Pt comes in via pov with complaints of vaginal bleeding. Pt states that she is about [redacted] weeks pregnant, and started bleeding yesterday, and that it has gotten heavier today with cramping. Pt complains of pain 10/10. Pt has a history of anemia. Pt is alert and oriented x4 with no signs of acute distress at this time.

## 2023-10-23 NOTE — ED Provider Notes (Signed)
 [facility] Provider Note    Event Date/Time   First MD Initiated Contact with Patient 10/23/23 1809     (approximate)   History   Vaginal Bleeding   HPI  Brenda Carpenter is a 23 y.o. female with history of anemia, G2, P1, presenting with vaginal bleeding.  Patient states that she is [redacted] weeks pregnant, had an ultrasound that was done last week that showed intrauterine pregnancy.  States that yesterday she had some spotting, had some vaginal bleeding, today, vaginal bleeding persisted and she noticed clots as well.  Has gone through 1 pad today.  Noted some abdominal cramping earlier before she expelled the clots.  Is scheduled to see her OB/GYN at 10 weeks.    On independent chart review, she has history of chronic iron  deficiency anemia secondary to menorrhagia, was admitted in February for symptomatic anemia requiring 1 unit of packed RBCs.  Also on independent chart review, she is A positive.  Physical Exam   Triage Vital Signs: ED Triage Vitals [10/23/23 1754]  Encounter Vitals Group     BP 101/72     Systolic BP Percentile      Diastolic BP Percentile      Pulse Rate 87     Resp 18     Temp 98.7 F (37.1 C)     Temp Source Oral     SpO2 100 %     Weight 154 lb (69.9 kg)     Height 5\' 1"  (1.549 m)     Head Circumference      Peak Flow      Pain Score 10     Pain Loc      Pain Education      Exclude from Growth Chart     Most recent vital signs: Vitals:   10/23/23 1754  BP: 101/72  Pulse: 87  Resp: 18  Temp: 98.7 F (37.1 C)  SpO2: 100%     General: Awake, no distress.  CV:  Good peripheral perfusion.  Resp:  Normal effort.  Abd:  No distention.  Soft nontender Other:  Moving all 4 extremities   ED Results / Procedures / Treatments   Labs (all labs ordered are listed, but only abnormal results are displayed) Labs Reviewed  CBC - Abnormal; Notable for the following components:      Result Value   Hemoglobin 10.0 (*)     HCT 33.2 (*)    MCV 70.5 (*)    MCH 21.2 (*)    RDW 16.6 (*)    All other components within normal limits  COMPREHENSIVE METABOLIC PANEL WITH GFR - Abnormal; Notable for the following components:   Glucose, Bld 113 (*)    AST 14 (*)    All other components within normal limits  HCG, QUANTITATIVE, PREGNANCY - Abnormal; Notable for the following components:   hCG, Beta Chain, Quant, S 15,187 (*)    All other components within normal limits  POC URINE PREG, ED  TYPE AND SCREEN     RADIOLOGY Pelvic ultrasound on my independent interpretation without intrauterine pregnancy   PROCEDURES:  Critical Care performed: No  Procedures   MEDICATIONS ORDERED IN ED: Medications  acetaminophen  (TYLENOL ) tablet 650 mg (650 mg Oral Given 10/23/23 2108)     IMPRESSION / MDM / ASSESSMENT AND PLAN / ED COURSE  I reviewed the triage vital signs and the nursing notes.  Differential diagnosis includes, but is not limited to, miscarriage, subchorionic hemorrhage, early bleeding during pregnancy, anemia.  Will get labs, UA, OB ultrasound.  Patient's presentation is most consistent with acute presentation with potential threat to life or bodily function.  Independent review of labs imaging below.  Extensive discussion with patient about imaging results, also discussed labs, did not see an intrauterine pregnancy on the ultrasound, this is concerning for miscarriage, discussed options with patient and she is proceeding with expectant management and will follow-up with her OB/GYN.  Discussed with her about following up in 1 to 2 days, and to get repeat hCG levels until they normalize.  Shared decision making done with patient and she is agreeable with the plan.  Considered but no indication for inpatient admission at this time, she is safe for outpatient management.  Will discharge with strict return precautions.   Clinical Course as of 10/23/23 2213  Tue Oct 23, 2023   2207 US  OB LESS THAN 14 WEEKS WITH OB TRANSVAGINAL IMPRESSION: 1. No intrauterine pregnancy identified. By definition, this reflects a pregnancy of unknown location. Differential considerations include early (normal) IUP, abnormal IUP/missed abortion, and nonvisualized ectopic pregnancy. Correlate with beta HCG and consider follow-up pelvic US  in 10-14 days.   [TT]  2207 Independent review of labs, ECGs elevated, electrolytes not severely deranged, creatinine is normal, no leukocytosis, hemoglobin is 10 but stable compared to prior [TT]    Clinical Course User Index [TT] Drenda Gentle, Richard Champion, MD     FINAL CLINICAL IMPRESSION(S) / ED DIAGNOSES   Final diagnoses:  Vaginal bleeding  Miscarriage     Rx / DC Orders   ED Discharge Orders     None        Note:  This document was prepared using Dragon voice recognition software and may include unintentional dictation errors.    Shane Darling, MD 10/23/23 2214

## 2023-10-23 NOTE — Discharge Instructions (Addendum)
 Please follow-up with your OB/GYN in 1 to 2 days to get reassessed, repeat your hCG levels as well as discuss further options for the miscarriage.  You can take Tylenol  every 6 hours as needed for pain.  Please return to the emergency department if you have severe pain, if you are having vaginal bleeding and are soaking through 2 pads per hour, if you have lightheadedness or if you pass out, or if you have any additional concerns.

## 2024-03-04 ENCOUNTER — Other Ambulatory Visit: Payer: Self-pay

## 2024-03-04 ENCOUNTER — Emergency Department
Admission: EM | Admit: 2024-03-04 | Discharge: 2024-03-04 | Disposition: A | Discharge status: Home / Self Care | Payer: Self-pay

## 2024-03-04 ENCOUNTER — Emergency Department: Payer: Self-pay

## 2024-03-04 DIAGNOSIS — O4690 Antepartum hemorrhage, unspecified, unspecified trimester: Secondary | ICD-10-CM

## 2024-03-04 DIAGNOSIS — O4691 Antepartum hemorrhage, unspecified, first trimester: Secondary | ICD-10-CM | POA: Diagnosis present

## 2024-03-04 DIAGNOSIS — Z3A01 Less than 8 weeks gestation of pregnancy: Secondary | ICD-10-CM | POA: Diagnosis not present

## 2024-03-04 LAB — CBC WITH DIFFERENTIAL/PLATELET
Abs Immature Granulocytes: 0.02 K/uL (ref 0.00–0.07)
Basophils Absolute: 0 K/uL (ref 0.0–0.1)
Basophils Relative: 1 %
Eosinophils Absolute: 0 K/uL (ref 0.0–0.5)
Eosinophils Relative: 0 %
HCT: 28.3 % — ABNORMAL LOW (ref 36.0–46.0)
Hemoglobin: 7.7 g/dL — ABNORMAL LOW (ref 12.0–15.0)
Immature Granulocytes: 0 %
Lymphocytes Relative: 41 %
Lymphs Abs: 2.2 K/uL (ref 0.7–4.0)
MCH: 18.1 pg — ABNORMAL LOW (ref 26.0–34.0)
MCHC: 27.2 g/dL — ABNORMAL LOW (ref 30.0–36.0)
MCV: 66.4 fL — ABNORMAL LOW (ref 80.0–100.0)
Monocytes Absolute: 0.4 K/uL (ref 0.1–1.0)
Monocytes Relative: 8 %
Neutro Abs: 2.7 K/uL (ref 1.7–7.7)
Neutrophils Relative %: 50 %
Platelets: 309 K/uL (ref 150–400)
RBC: 4.26 MIL/uL (ref 3.87–5.11)
RDW: 17.1 % — ABNORMAL HIGH (ref 11.5–15.5)
WBC: 5.4 K/uL (ref 4.0–10.5)
nRBC: 0 % (ref 0.0–0.2)

## 2024-03-04 LAB — BASIC METABOLIC PANEL WITH GFR
Anion gap: 8 (ref 5–15)
BUN: 13 mg/dL (ref 6–20)
CO2: 21 mmol/L — ABNORMAL LOW (ref 22–32)
Calcium: 8.7 mg/dL — ABNORMAL LOW (ref 8.9–10.3)
Chloride: 106 mmol/L (ref 98–111)
Creatinine, Ser: 0.71 mg/dL (ref 0.44–1.00)
GFR, Estimated: >60 mL/min (ref >60)
Glucose, Bld: 82 mg/dL (ref 70–99)
Potassium: 3.7 mmol/L (ref 3.5–5.1)
Sodium: 135 mmol/L (ref 135–145)

## 2024-03-04 LAB — URINALYSIS, ROUTINE W REFLEX MICROSCOPIC
Bilirubin Urine: NEGATIVE
Glucose, UA: NEGATIVE mg/dL
Hgb urine dipstick: NEGATIVE
Ketones, ur: NEGATIVE mg/dL
Nitrite: NEGATIVE
Protein, ur: NEGATIVE mg/dL
Specific Gravity, Urine: 1.025 (ref 1.005–1.030)
pH: 6 (ref 5.0–8.0)

## 2024-03-04 LAB — HCG, QUANTITATIVE, PREGNANCY: hCG, Beta Chain, Quant, S: 61709 m[IU]/mL — ABNORMAL HIGH (ref <5)

## 2024-03-04 LAB — PREGNANCY, URINE: Preg Test, Ur: POSITIVE — AB

## 2024-03-04 MED ORDER — ONDANSETRON 4 MG PO TBDP: 4.0000 mg | ORAL_TABLET | Freq: Three times a day (TID) | ORAL | 0 refills | Status: DC | PRN

## 2024-03-04 MED ORDER — FERROUS SULFATE 325 (65 FE) MG PO TABS: 325.0000 mg | ORAL_TABLET | Freq: Every day | ORAL | 3 refills | Status: DC

## 2024-03-04 NOTE — ED Provider Notes (Signed)
 [facility] Emergency Department Provider Note     Event Date/Time   First MD Initiated Contact with Patient 03/04/24 1942     (approximate)   History   Vaginal Bleeding   HPI  Brenda Carpenter is a 23 y.o. female with a past medical history of anemia presents to the ED for evaluation of vaginal spotting and lower abdominal cramping during early pregnancy.  She describes vaginal spotting only when wiping and a brown like discharge.  She reports she passed 1 small blood clot but has not experienced this since.  Patient reports she had a ultrasound performed on 09/02, where visualization of the embryo was absent and there was no cardiac activity.  Unknown LMP.  Endorses nausea without vomiting however not during my evaluation.  No other complaint.     Physical Exam   Triage Vital Signs: ED Triage Vitals  Encounter Vitals Group     BP 03/04/24 1900 117/62     Girls Systolic BP Percentile --      Girls Diastolic BP Percentile --      Boys Systolic BP Percentile --      Boys Diastolic BP Percentile --      Pulse Rate 03/04/24 1900 79     Resp 03/04/24 1900 16     Temp 03/04/24 1900 98.3 F (36.8 C)     Temp Source 03/04/24 1900 Oral     SpO2 03/04/24 1900 100 %     Weight 03/04/24 1901 158 lb (71.7 kg)     Height 03/04/24 1901 5' 1 (1.549 m)     Head Circumference --      Peak Flow --      Pain Score 03/04/24 1907 0     Pain Loc --      Pain Education --      Exclude from Growth Chart --     Most recent vital signs: Vitals:   03/04/24 1900  BP: 117/62  Pulse: 79  Resp: 16  Temp: 98.3 F (36.8 C)  SpO2: 100%    General Well appearing.  Awake, no distress.  HEENT NCAT.  CV:  Good peripheral perfusion.  RRR RESP:  Normal effort.  LCTAB. ABD:  No distention.  Soft, nontender  ED Results / Procedures / Treatments   Labs (all labs ordered are listed, but only abnormal results are displayed) Labs Reviewed  CBC WITH  DIFFERENTIAL/PLATELET - Abnormal; Notable for the following components:      Result Value   Hemoglobin 7.7 (*)    HCT 28.3 (*)    MCV 66.4 (*)    MCH 18.1 (*)    MCHC 27.2 (*)    RDW 17.1 (*)    All other components within normal limits  BASIC METABOLIC PANEL WITH GFR - Abnormal; Notable for the following components:   CO2 21 (*)    Calcium  8.7 (*)    All other components within normal limits  URINALYSIS, ROUTINE W REFLEX MICROSCOPIC - Abnormal; Notable for the following components:   Color, Urine YELLOW (*)    APPearance CLOUDY (*)    Leukocytes,Ua TRACE (*)    Bacteria, UA RARE (*)    All other components within normal limits  HCG, QUANTITATIVE, PREGNANCY - Abnormal; Notable for the following components:   hCG, Beta Chain, Quant, S 61,709 (*)    All other components within normal limits  PREGNANCY, URINE - Abnormal; Notable for the following components:   Preg Test, Ur POSITIVE (*)  All other components within normal limits  URINE CULTURE   RADIOLOGY  I personally viewed and evaluated these images as part of my medical decision making, as well as reviewing the written report by the radiologist.  ED Provider Interpretation: Single intrauterine pregnancy with visualized embryo and cardiac cavity.  US  OB Comp < 14 Wks Result Date: 03/04/2024 CLINICAL DATA:  Nodule no bleeding EXAM: OBSTETRIC <14 WK US  AND TRANSVAGINAL OB US  TECHNIQUE: Both transabdominal and transvaginal ultrasound examinations were performed for complete evaluation of the gestation as well as the maternal uterus, adnexal regions, and pelvic cul-de-sac. Transvaginal technique was performed to assess early pregnancy. COMPARISON:  None Available. FINDINGS: Intrauterine gestational sac: Single intrauterine gestational sac Yolk sac:  Visualized Embryo:  Visualized Cardiac Activity: Visualized Heart Rate: 117 bpm CRL:  8.4 mm   6 w   6 D d                  US  EDC: 10/22/2024 Subchorionic hemorrhage:  None visualized.  Maternal uterus/adnexae: Ovaries are within normal limits. Right ovary measures 2.7 x 1.9 by 3.6 cm. The left ovary measures 3.8 x 2 by 3.1 cm. No significant free fluid IMPRESSION: Single intrauterine pregnancy with visualized embryo and cardiac activity. No specific abnormalities seen Electronically Signed   By: Luke Bun M.D.   On: 03/04/2024 22:07   US  OB Transvaginal Result Date: 03/04/2024 CLINICAL DATA:  Nodule no bleeding EXAM: OBSTETRIC <14 WK US  AND TRANSVAGINAL OB US  TECHNIQUE: Both transabdominal and transvaginal ultrasound examinations were performed for complete evaluation of the gestation as well as the maternal uterus, adnexal regions, and pelvic cul-de-sac. Transvaginal technique was performed to assess early pregnancy. COMPARISON:  None Available. FINDINGS: Intrauterine gestational sac: Single intrauterine gestational sac Yolk sac:  Visualized Embryo:  Visualized Cardiac Activity: Visualized Heart Rate: 117 bpm CRL:  8.4 mm   6 w   6 D d                  US  EDC: 10/22/2024 Subchorionic hemorrhage:  None visualized. Maternal uterus/adnexae: Ovaries are within normal limits. Right ovary measures 2.7 x 1.9 by 3.6 cm. The left ovary measures 3.8 x 2 by 3.1 cm. No significant free fluid IMPRESSION: Single intrauterine pregnancy with visualized embryo and cardiac activity. No specific abnormalities seen Electronically Signed   By: Luke Bun M.D.   On: 03/04/2024 22:07    PROCEDURES:  Critical Care performed: No  Procedures   MEDICATIONS ORDERED IN ED: Medications - No data to display   IMPRESSION / MDM / ASSESSMENT AND PLAN / ED COURSE  I reviewed the triage vital signs and the nursing notes.                              Clinical Course as of 03/04/24 2305  Tue Mar 04, 2024  2156 Repeat CBC in 48 hours and start iron  pills [MH]  2247 CBC with Differential(!) Hemoglobin 7.7 consistent with trend.  Patient reports baseline. [MH]  2258 Urinalysis, Routine w reflex  microscopic -Urine, Clean Catch(!) Trace leukocytes.  Patient is asymptomatic.  Given pregnancy status will send urine culture. [MH]  2258 hCG, quantitative, pregnancy(!) 61,709 [MH]  2258 US  OB Comp < 14 Wks IMPRESSION: Single intrauterine pregnancy with visualized embryo and cardiac activity. No specific abnormalities seen   [MH]    Clinical Course User Index [MH] Margrette Monte A, PA-C    23 y.o. female  presents to the emergency department for evaluation and treatment of vaginal bleeding during early pregnancy. See HPI for further details.   Differential diagnosis includes, but is not limited to, threatened miscarriage, incomplete miscarriage, normal bleeding from an early trimester pregnancy, ectopic pregnancy, , blighted ovum, vaginal/cervical trauma, subchorionic hemorrhage/hematoma, etc.   Patient's presentation is most consistent with acute complicated illness / injury requiring diagnostic workup.  Patient is alert and oriented.  She is hemodynamically stable and afebrile.  Hearing on initial assessment, in no acute distress.  Physical exam findings are stated above and overall benign.  Please see clinical course note.  Ultrasound reveals single intrauterine pregnancy with visualized embryonic cardiac activity.  This is very reassuring.  Patient has a scheduled appointment with OB/GYN on September 24.  I have encouraged her to continue this appointment.  I have also advised to repeat CBC in 48 hours given her hemoglobin.  She verbalized understanding.  Advised to start iron  pills.  Patient stable condition for discharge home.  ED return precautions discussed.  All questions and concerns were addressed during this ED visit.  FINAL CLINICAL IMPRESSION(S) / ED DIAGNOSES   Final diagnoses:  Vaginal bleeding in pregnancy   Rx / DC Orders   ED Discharge Orders          Ordered    ondansetron  (ZOFRAN -ODT) 4 MG disintegrating tablet  Every 8 hours PRN        03/04/24 2253     ferrous sulfate  325 (65 FE) MG tablet  Daily        03/04/24 2300           Note:  This document was prepared using Dragon voice recognition software and may include unintentional dictation errors.    Margrette, Jadira Nierman A, PA-C 03/04/24 2307    Nicholaus Rolland BRAVO, MD 03/04/24 [phone]

## 2024-03-04 NOTE — Discharge Instructions (Addendum)
 Your evaluated in the ED for vaginal bleeding during early pregnancy.  Your lab work is reassuring.  Your hemoglobin is fairly low however this is consistent with your trend and baseline.  Iron  pills have been sent to your pharmacy for you to take.  You are advised to repeat your CBC (blood draw) in 48 hours to monitor this.  Your urinalysis reveals a trace of leukocytes.  A urine culture has been sent and is pending.  If any growth on this culture you will receive a call and appropriate antibiotics needed for urinary tract infection however given that you are asymptomatic this is less likely.  Your ultrasound reveals a single intrauterine pregnancy with cardiac activity of 117 bpm estimating your gestational age at 6 weeks and 6 days.  Please follow-up with your OB/GYN for further management.  Continue to go to your scheduled appointment on September 24 or try to make an earlier appointment.  Increase hydration.  If any worsening or new symptoms occur please feel free to return to ED for further evaluation.

## 2024-03-04 NOTE — ED Triage Notes (Signed)
 Pt reports she had ultrasound last week that determined her fetus had no pulse, pt reports she is not even sure how far along she is, pt has had vaginal spotting and abd cramping over past few days.

## 2024-03-04 NOTE — ED Notes (Signed)
 Patient provided with discharge instructions including prescriptions x2 and importance of follow up appt as needed with stated understanding. Patient stable and ambulatory with steady even gait on dispo.

## 2024-03-05 LAB — URINE CULTURE: Culture: >=100000 — AB

## 2024-03-16 ENCOUNTER — Emergency Department
Admission: EM | Admit: 2024-03-16 | Discharge: 2024-03-16 | Disposition: A | Discharge status: Home / Self Care | Payer: Self-pay | Attending: Emergency Medicine | Admitting: Emergency Medicine

## 2024-03-16 ENCOUNTER — Other Ambulatory Visit: Payer: Self-pay

## 2024-03-16 ENCOUNTER — Emergency Department: Payer: Self-pay

## 2024-03-16 DIAGNOSIS — N390 Urinary tract infection, site not specified: Secondary | ICD-10-CM

## 2024-03-16 DIAGNOSIS — O2341 Unspecified infection of urinary tract in pregnancy, first trimester: Secondary | ICD-10-CM | POA: Insufficient documentation

## 2024-03-16 DIAGNOSIS — O210 Mild hyperemesis gravidarum: Secondary | ICD-10-CM | POA: Insufficient documentation

## 2024-03-16 DIAGNOSIS — E860 Dehydration: Secondary | ICD-10-CM | POA: Insufficient documentation

## 2024-03-16 DIAGNOSIS — Z3A09 9 weeks gestation of pregnancy: Secondary | ICD-10-CM | POA: Insufficient documentation

## 2024-03-16 DIAGNOSIS — O219 Vomiting of pregnancy, unspecified: Secondary | ICD-10-CM

## 2024-03-16 HISTORY — DX: Encounter for other specified aftercare: Z51.89

## 2024-03-16 LAB — LIPASE, BLOOD: Lipase: 57 U/L — ABNORMAL HIGH (ref 11–51)

## 2024-03-16 LAB — POC URINE PREG, ED: Preg Test, Ur: POSITIVE — AB

## 2024-03-16 LAB — URINALYSIS, ROUTINE W REFLEX MICROSCOPIC
Bilirubin Urine: NEGATIVE
Glucose, UA: NEGATIVE mg/dL
Hgb urine dipstick: NEGATIVE
Ketones, ur: 20 mg/dL — AB
Nitrite: NEGATIVE
Protein, ur: 30 mg/dL — AB
Specific Gravity, Urine: 1.017 (ref 1.005–1.030)
Squamous Epithelial / HPF: >50 /HPF (ref 0–5)
pH: 5 (ref 5.0–8.0)

## 2024-03-16 LAB — COMPREHENSIVE METABOLIC PANEL WITH GFR
ALT: 14 U/L (ref 0–44)
AST: 17 U/L (ref 15–41)
Albumin: 4 g/dL (ref 3.5–5.0)
Alkaline Phosphatase: 50 U/L (ref 38–126)
Anion gap: 10 (ref 5–15)
BUN: 10 mg/dL (ref 6–20)
CO2: 22 mmol/L (ref 22–32)
Calcium: 9.6 mg/dL (ref 8.9–10.3)
Chloride: 101 mmol/L (ref 98–111)
Creatinine, Ser: 0.67 mg/dL (ref 0.44–1.00)
GFR, Estimated: >60 mL/min (ref >60)
Glucose, Bld: 85 mg/dL (ref 70–99)
Potassium: 3.6 mmol/L (ref 3.5–5.1)
Sodium: 133 mmol/L — ABNORMAL LOW (ref 135–145)
Total Bilirubin: 0.4 mg/dL (ref 0.0–1.2)
Total Protein: 8.4 g/dL — ABNORMAL HIGH (ref 6.5–8.1)

## 2024-03-16 LAB — CBC
HCT: 33.4 % — ABNORMAL LOW (ref 36.0–46.0)
Hemoglobin: 9.3 g/dL — ABNORMAL LOW (ref 12.0–15.0)
MCH: 17.8 pg — ABNORMAL LOW (ref 26.0–34.0)
MCHC: 27.8 g/dL — ABNORMAL LOW (ref 30.0–36.0)
MCV: 63.9 fL — ABNORMAL LOW (ref 80.0–100.0)
Platelets: 352 K/uL (ref 150–400)
RBC: 5.23 MIL/uL — ABNORMAL HIGH (ref 3.87–5.11)
RDW: 17 % — ABNORMAL HIGH (ref 11.5–15.5)
WBC: 4.9 K/uL (ref 4.0–10.5)
nRBC: 0 % (ref 0.0–0.2)

## 2024-03-16 MED ORDER — SODIUM CHLORIDE 0.9 % IV BOLUS: 1000.0000 mL | Freq: Once | INTRAVENOUS | Status: DC

## 2024-03-16 MED ORDER — CEPHALEXIN 500 MG PO CAPS: 500.0000 mg | ORAL_CAPSULE | Freq: Three times a day (TID) | ORAL | 0 refills | Status: AC

## 2024-03-16 MED ORDER — METOCLOPRAMIDE HCL 10 MG PO TABS: 10.0000 mg | ORAL_TABLET | Freq: Three times a day (TID) | ORAL | 1 refills | Status: DC

## 2024-03-16 MED ORDER — SODIUM CHLORIDE 0.9 % IV BOLUS
1000.0000 mL | Freq: Once | INTRAVENOUS | Status: AC
  Administered 2024-03-16: 1000 mL via INTRAVENOUS

## 2024-03-16 MED ORDER — METOCLOPRAMIDE HCL 5 MG/ML IJ SOLN
10.0000 mg | Freq: Once | INTRAMUSCULAR | Status: AC
  Administered 2024-03-16: 10 mg via INTRAVENOUS
  Filled 2024-03-16: qty 2

## 2024-03-16 NOTE — ED Triage Notes (Signed)
 Pt to ED for emesis in pregnancy around 8-9 weeks pregnancy. States cannot keep food or water down and has tried Zofran  and B6. Lower abdomen is sore. Ongoing for several weeks.  Last week had blood checked and hgb was either 7 or 9, pt unsure. Hx anemia, pt believes is IDA.   Pt states she feels weak and dehydrated. IV access attempted X1. Poor peripheral vasculature.

## 2024-03-16 NOTE — Discharge Instructions (Signed)
 Follow-up with your regular doctor Return emergency department for worsening Take medication as prescribed

## 2024-03-16 NOTE — ED Provider Notes (Signed)
 [facility] Provider Note    Event Date/Time   First MD Initiated Contact with Patient 03/16/24 1823     (approximate)   History   Emesis   HPI  Brenda Carpenter is a 23 y.o. female history of anemia and who is currently 8 to [redacted] weeks pregnant presents emergency department with continued hyperemesis in pregnancy.  States feels dizzy.  Unsure if her hemoglobin is dropped.  Patient states she has had several ultrasounds and does have a IUP.  Has some cramping but no bleeding.  States she feels weak and dehydrated.  Has used B6 and Zofran  without any relief      Physical Exam   Triage Vital Signs: ED Triage Vitals  Encounter Vitals Group     BP 03/16/24 1822 119/70     Girls Systolic BP Percentile --      Girls Diastolic BP Percentile --      Boys Systolic BP Percentile --      Boys Diastolic BP Percentile --      Pulse Rate 03/16/24 1822 (!) 101     Resp 03/16/24 1822 20     Temp 03/16/24 1822 98.6 F (37 C)     Temp Source 03/16/24 1822 Oral     SpO2 03/16/24 1822 100 %     Weight 03/16/24 1813 140 lb (63.5 kg)     Height 03/16/24 1813 5' 1 (1.549 m)     Head Circumference --      Peak Flow --      Pain Score 03/16/24 1811 8     Pain Loc --      Pain Education --      Exclude from Growth Chart --     Most recent vital signs: Vitals:   03/16/24 2201 03/16/24 2322  BP: 120/68 107/60  Pulse: 76 78  Resp: 18 16  Temp: 98.8 F (37.1 C)   SpO2: 100% 100%     General: Awake, no distress.   CV:  Good peripheral perfusion Resp:  Normal effort.  Abd:  No distention.  Tender in the right upper quadrant Other:     ED Results / Procedures / Treatments   Labs (all labs ordered are listed, but only abnormal results are displayed) Labs Reviewed  LIPASE, BLOOD - Abnormal; Notable for the following components:      Result Value   Lipase 57 (*)    All other components within normal limits  COMPREHENSIVE METABOLIC PANEL WITH GFR - Abnormal;  Notable for the following components:   Sodium 133 (*)    Total Protein 8.4 (*)    All other components within normal limits  CBC - Abnormal; Notable for the following components:   RBC 5.23 (*)    Hemoglobin 9.3 (*)    HCT 33.4 (*)    MCV 63.9 (*)    MCH 17.8 (*)    MCHC 27.8 (*)    RDW 17.0 (*)    All other components within normal limits  URINALYSIS, ROUTINE W REFLEX MICROSCOPIC - Abnormal; Notable for the following components:   Color, Urine YELLOW (*)    APPearance CLOUDY (*)    Ketones, ur 20 (*)    Protein, ur 30 (*)    Leukocytes,Ua LARGE (*)    Bacteria, UA RARE (*)    All other components within normal limits  POC URINE PREG, ED - Abnormal; Notable for the following components:   Preg Test, Ur Positive (*)  All other components within normal limits  HCG, QUANTITATIVE, PREGNANCY     EKG     RADIOLOGY Ultrasound right upper quadrant    PROCEDURES:   Procedures  Critical Care:   Chief Complaint  Patient presents with   Emesis      MEDICATIONS ORDERED IN ED: Medications  sodium chloride  0.9 % bolus 1,000 mL (1,000 mLs Intravenous Not Given 03/16/24 2151)  sodium chloride  0.9 % bolus 1,000 mL (0 mLs Intravenous Stopped 03/16/24 2325)  metoCLOPramide  (REGLAN ) injection 10 mg (10 mg Intravenous Given 03/16/24 1915)     IMPRESSION / MDM / ASSESSMENT AND PLAN / ED COURSE  I reviewed the triage vital signs and the nursing notes.                              Differential diagnosis includes, but is not limited to, hyperemesis in pregnancy, acute cholecystitis, anemia, dehydration  Patient's presentation is most consistent with acute illness / injury with system symptoms.   Medications given: Patient was given normal saline 1 L IV, Reglan  10 mg IV.    Will check labs Since the patient does not have any bleeding, I have low concerns for miscarriage  However due to right upper quadrant tenderness we will order ultrasound for right upper  quadrant  Patient's labs are reassuring, she is is anemic but this is in her normal trend  Ultrasound for the right upper quadrant was independently reviewed interpreted by me as being negative for any acute abnormality  Patient was given normal saline 1 L IV, Reglan  10 mg IV.  After medication patient states she is feeling better.  Her UA does show large amount of leuks with some bacteria.  Will go ahead and place her on antibiotic to ensure she is not developing a UTI.  Still do not see any red flags to warrant ultrasound OB.  She is to follow-up with her doctor, follow-up with her OB/GYN, return emergency department worsening.  She is given prescription for Reglan  and discharged stable condition.      FINAL CLINICAL IMPRESSION(S) / ED DIAGNOSES   Final diagnoses:  Excessive vomiting in pregnancy  Dehydration  Acute UTI     Rx / DC Orders   ED Discharge Orders          Ordered    cephALEXin  (KEFLEX ) 500 MG capsule  3 times daily        03/16/24 2308    metoCLOPramide  (REGLAN ) 10 MG tablet  3 times daily with meals        03/16/24 2308             Note:  This document was prepared using Dragon voice recognition software and may include unintentional dictation errors.    Gasper Devere ORN, PA-C 03/16/24 LEVERN    Jossie Artist POUR, MD 03/22/24 [phone]

## 2024-03-17 LAB — HCG, QUANTITATIVE, PREGNANCY: hCG, Beta Chain, Quant, S: 203261 m[IU]/mL — ABNORMAL HIGH (ref <5)

## 2024-04-06 LAB — PANORAMA PRENATAL TEST FULL PANEL:PANORAMA TEST PLUS 5 ADDITIONAL MICRODELETIONS: FETAL FRACTION: 5.1

## 2024-04-24 ENCOUNTER — Emergency Department
Admission: EM | Admit: 2024-04-24 | Discharge: 2024-04-24 | Disposition: A | Discharge status: Home / Self Care | Payer: Self-pay | Attending: Emergency Medicine | Admitting: Emergency Medicine

## 2024-04-24 ENCOUNTER — Encounter: Payer: Self-pay | Admitting: Intensive Care

## 2024-04-24 ENCOUNTER — Other Ambulatory Visit: Payer: Self-pay

## 2024-04-24 DIAGNOSIS — L02411 Cutaneous abscess of right axilla: Secondary | ICD-10-CM | POA: Insufficient documentation

## 2024-04-24 MED ORDER — CEPHALEXIN 500 MG PO CAPS: 500.0000 mg | ORAL_CAPSULE | Freq: Four times a day (QID) | ORAL | 0 refills | Status: AC

## 2024-04-24 MED ORDER — LIDOCAINE HCL (PF) 1 % IJ SOLN
5.0000 mL | Freq: Once | INTRAMUSCULAR | Status: AC
  Administered 2024-04-24: 5 mL via INTRADERMAL
  Filled 2024-04-24: qty 5

## 2024-04-24 NOTE — Discharge Instructions (Signed)
 The abscess was drained today.  Please apply warm compresses at least twice a day to encourage it to continue to drain.  You may notice some purulent or bloody discharge.  This is normal and expected.  Please return to the emergency department with any worsening symptoms.  I have sent antibiotics for you to take, please take them as prescribed.  Make sure you take all the medication even if you begin to feel better.

## 2024-04-24 NOTE — ED Triage Notes (Signed)
 Patient reports abscess under right axilla. Reports hard and no drainage

## 2024-04-24 NOTE — ED Provider Notes (Signed)
 [facility] Provider Note    Event Date/Time   First MD Initiated Contact with Patient 04/24/24 0830     (approximate)   History   Abscess   HPI  Brenda Carpenter is a 23 y.o. female with PMH of anemia presents for evaluation of an abscess under the right axilla.  Patient states has been there for 2 days and has been getting worse.  She states that she has had abscesses before.  Has not noticed any drainage from this area.  She also has a spot on her forehead actively draining.      Physical Exam   Triage Vital Signs: ED Triage Vitals  Encounter Vitals Group     BP 04/24/24 0818 126/77     Girls Systolic BP Percentile --      Girls Diastolic BP Percentile --      Boys Systolic BP Percentile --      Boys Diastolic BP Percentile --      Pulse Rate 04/24/24 0818 (!) 105     Resp 04/24/24 0818 16     Temp 04/24/24 0818 98.4 F (36.9 C)     Temp Source 04/24/24 0818 Oral     SpO2 04/24/24 0818 100 %     Weight 04/24/24 0817 155 lb (70.3 kg)     Height 04/24/24 0817 5' 1 (1.549 m)     Head Circumference --      Peak Flow --      Pain Score 04/24/24 0817 10     Pain Loc --      Pain Education --      Exclude from Growth Chart --     Most recent vital signs: Vitals:   04/24/24 0818  BP: 126/77  Pulse: (!) 105  Resp: 16  Temp: 98.4 F (36.9 C)  SpO2: 100%   General: Awake, no distress.  CV:  Good peripheral perfusion.  Resp:  Normal effort.  Abd:  No distention.  Other:  Raised area in the right axilla approximately 3 cm in diameter, no overlying erythema, very tender to palpation.   ED Results / Procedures / Treatments   Labs (all labs ordered are listed, but only abnormal results are displayed) Labs Reviewed - No data to display    PROCEDURES:  Critical Care performed: No  .Ultrasound ED Soft Tissue  Date/Time: 04/24/2024 9:49 AM  Performed by: Cleaster Tinnie LABOR, PA-C Authorized by: Cleaster Tinnie LABOR, PA-C    Procedure details:    Indications: localization of abscess     Transverse view:  Visualized   Longitudinal view:  Visualized   Images: not archived   Location:    Location: axilla     Side:  Left Findings:     abscess present .Incision and Drainage  Date/Time: 04/24/2024 9:50 AM  Performed by: Cleaster Tinnie LABOR, PA-C Authorized by: Cleaster Tinnie LABOR, PA-C   Consent:    Consent obtained:  Verbal   Consent given by:  Patient   Risks, benefits, and alternatives were discussed: yes     Risks discussed:  Bleeding, incomplete drainage, pain, infection and damage to other organs   Alternatives discussed:  No treatment Universal protocol:    Patient identity confirmed:  Verbally with patient Location:    Type:  Abscess   Size:  3 cm   Location:  Upper extremity   Upper extremity location:  Shoulder   Shoulder location:  R shoulder (Right axilla) Pre-procedure details:    Skin  preparation:  Povidone-iodine Sedation:    Sedation type:  None Anesthesia:    Anesthesia method:  Local infiltration   Local anesthetic:  Lidocaine 1% w/o epi Procedure type:    Complexity:  Simple Procedure details:    Incision types:  Single straight   Incision depth:  Dermal   Wound management:  Probed and deloculated and irrigated with saline   Drainage:  Bloody and purulent   Drainage amount:  Moderate   Wound treatment:  Wound left open   Packing materials:  None Post-procedure details:    Procedure completion:  Tolerated well, no immediate complications    MEDICATIONS ORDERED IN ED: Medications  lidocaine (PF) (XYLOCAINE) 1 % injection 5 mL (5 mLs Intradermal Given by Other 04/24/24 9066)     IMPRESSION / MDM / ASSESSMENT AND PLAN / ED COURSE  I reviewed the triage vital signs and the nursing notes.                             23 year old female presents for evaluation of abscess to the right axilla.   Patient was tachycardic on presentation, vital signs stable otherwise,  patient very uncomfortable on exam.  Differential diagnosis includes, but is not limited to, abscess, cellulitis, folliculitis, enlarged lymph node.  Patient's presentation is most consistent with acute, uncomplicated illness.  Bedside ultrasound used to confirm presence of abscess.  Patient was agreeable to incision and drainage.  Please see procedure notes above.  Will start her on oral antibiotics as the spot draining on her forehead has some surrounding cellulitis.  Did discuss warm compresses and OTC pain management.  She is given a note for work.  Return precautions.  She voiced understanding, questions were answered and she stable at discharge.      FINAL CLINICAL IMPRESSION(S) / ED DIAGNOSES   Final diagnoses:  Abscess of axilla, right     Rx / DC Orders   ED Discharge Orders          Ordered    cephALEXin  (KEFLEX ) 500 MG capsule  4 times daily        04/24/24 9047             Note:  This document was prepared using Dragon voice recognition software and may include unintentional dictation errors.   Cleaster Tinnie LABOR, PA-C 04/24/24 9046    Dorothyann Drivers, MD 04/24/24 [phone]

## 2024-04-24 NOTE — ED Notes (Signed)
 See triage note  Presents with possible abscess area under right arm  Afebrile on arrival

## 2024-07-01 ENCOUNTER — Observation Stay
Admission: EM | Admit: 2024-07-01 | Discharge: 2024-07-02 | Disposition: A | Discharge status: Home / Self Care | Payer: Self-pay | Attending: Emergency Medicine | Admitting: Emergency Medicine

## 2024-07-01 ENCOUNTER — Encounter: Payer: Self-pay | Admitting: Emergency Medicine

## 2024-07-01 ENCOUNTER — Emergency Department: Payer: Self-pay

## 2024-07-01 ENCOUNTER — Other Ambulatory Visit: Payer: Self-pay

## 2024-07-01 DIAGNOSIS — R1032 Left lower quadrant pain: Secondary | ICD-10-CM

## 2024-07-01 DIAGNOSIS — N83299 Other ovarian cyst, unspecified side: Secondary | ICD-10-CM | POA: Diagnosis present

## 2024-07-01 DIAGNOSIS — N838 Other noninflammatory disorders of ovary, fallopian tube and broad ligament: Principal | ICD-10-CM

## 2024-07-01 DIAGNOSIS — N83202 Unspecified ovarian cyst, left side: Principal | ICD-10-CM | POA: Insufficient documentation

## 2024-07-01 LAB — CBC
HCT: 29.8 % — ABNORMAL LOW (ref 36.0–46.0)
Hemoglobin: 8 g/dL — ABNORMAL LOW (ref 12.0–15.0)
MCH: 17.6 pg — ABNORMAL LOW (ref 26.0–34.0)
MCHC: 26.8 g/dL — ABNORMAL LOW (ref 30.0–36.0)
MCV: 65.6 fL — ABNORMAL LOW (ref 80.0–100.0)
Platelets: 424 K/uL — ABNORMAL HIGH (ref 150–400)
RBC: 4.54 MIL/uL (ref 3.87–5.11)
RDW: 18.2 % — ABNORMAL HIGH (ref 11.5–15.5)
WBC: 4 K/uL (ref 4.0–10.5)
nRBC: 0 % (ref 0.0–0.2)

## 2024-07-01 LAB — TYPE AND SCREEN
ABO/RH(D): A POS
Antibody Screen: NEGATIVE

## 2024-07-01 LAB — COMPREHENSIVE METABOLIC PANEL WITH GFR
ALT: 15 U/L (ref 0–44)
AST: 16 U/L (ref 15–41)
Albumin: 4.1 g/dL (ref 3.5–5.0)
Alkaline Phosphatase: 66 U/L (ref 38–126)
Anion gap: 11 (ref 5–15)
BUN: 9 mg/dL (ref 6–20)
CO2: 22 mmol/L (ref 22–32)
Calcium: 9.1 mg/dL (ref 8.9–10.3)
Chloride: 105 mmol/L (ref 98–111)
Creatinine, Ser: 0.72 mg/dL (ref 0.44–1.00)
GFR, Estimated: >60 mL/min
Glucose, Bld: 98 mg/dL (ref 70–99)
Potassium: 3.9 mmol/L (ref 3.5–5.1)
Sodium: 138 mmol/L (ref 135–145)
Total Bilirubin: 0.2 mg/dL (ref 0.0–1.2)
Total Protein: 7.5 g/dL (ref 6.5–8.1)

## 2024-07-01 LAB — POC URINE PREG, ED: Preg Test, Ur: NEGATIVE

## 2024-07-01 LAB — URINALYSIS, ROUTINE W REFLEX MICROSCOPIC
Bilirubin Urine: NEGATIVE
Glucose, UA: NEGATIVE mg/dL
Hgb urine dipstick: NEGATIVE
Ketones, ur: NEGATIVE mg/dL
Leukocytes,Ua: NEGATIVE
Nitrite: NEGATIVE
Protein, ur: NEGATIVE mg/dL
Specific Gravity, Urine: 1.02 (ref 1.005–1.030)
pH: 7 (ref 5.0–8.0)

## 2024-07-01 LAB — LIPASE, BLOOD: Lipase: 43 U/L (ref 11–51)

## 2024-07-01 MED ORDER — ALUM & MAG HYDROXIDE-SIMETH 200-200-20 MG/5ML PO SUSP: 30.0000 mL | ORAL | Status: DC | PRN

## 2024-07-01 MED ORDER — LACTATED RINGERS IV SOLN: INTRAVENOUS | Status: DC

## 2024-07-01 MED ORDER — SIMETHICONE 80 MG PO CHEW: 80.0000 mg | CHEWABLE_TABLET | Freq: Four times a day (QID) | ORAL | Status: DC | PRN

## 2024-07-01 MED ORDER — DOCUSATE SODIUM 100 MG PO CAPS: 100.0000 mg | ORAL_CAPSULE | Freq: Two times a day (BID) | ORAL | Status: DC | PRN

## 2024-07-01 MED ORDER — MORPHINE SULFATE (PF) 2 MG/ML IV SOLN: 1.0000 mg | INTRAVENOUS | Status: DC | PRN

## 2024-07-01 MED ORDER — KETOROLAC TROMETHAMINE 15 MG/ML IJ SOLN: 15.0000 mg | Freq: Once | INTRAMUSCULAR | Status: DC

## 2024-07-01 MED ORDER — IBUPROFEN 600 MG PO TABS
600.0000 mg | ORAL_TABLET | Freq: Four times a day (QID) | ORAL | Status: DC | PRN
  Administered 2024-07-02: 600 mg via ORAL
  Filled 2024-07-01: qty 1

## 2024-07-01 MED ORDER — FENTANYL CITRATE (PF) 50 MCG/ML IJ SOSY
25.0000 ug | PREFILLED_SYRINGE | Freq: Once | INTRAMUSCULAR | Status: AC
  Administered 2024-07-01: 25 ug via INTRAVENOUS
  Filled 2024-07-01: qty 1

## 2024-07-01 MED ORDER — HYDROMORPHONE HCL 1 MG/ML IJ SOLN: 1.0000 mg | INTRAMUSCULAR | Status: DC | PRN

## 2024-07-01 MED ORDER — KETOROLAC TROMETHAMINE 15 MG/ML IJ SOLN
15.0000 mg | Freq: Once | INTRAMUSCULAR | Status: AC
  Administered 2024-07-01: 15 mg via INTRAVENOUS
  Filled 2024-07-01: qty 1

## 2024-07-01 MED ORDER — ONDANSETRON HCL 4 MG PO TABS: 4.0000 mg | ORAL_TABLET | Freq: Four times a day (QID) | ORAL | Status: DC | PRN

## 2024-07-01 MED ORDER — ONDANSETRON HCL 4 MG/2ML IJ SOLN: 4.0000 mg | Freq: Four times a day (QID) | INTRAMUSCULAR | Status: DC | PRN

## 2024-07-01 MED ORDER — SODIUM CHLORIDE 0.9 % IV BOLUS
1000.0000 mL | Freq: Once | INTRAVENOUS | Status: AC
  Administered 2024-07-01: 1000 mL via INTRAVENOUS

## 2024-07-01 MED ORDER — MORPHINE SULFATE (PF) 2 MG/ML IV SOLN
2.0000 mg | INTRAVENOUS | Status: DC | PRN
  Administered 2024-07-01 (×2): 2 mg via INTRAVENOUS
  Filled 2024-07-01 (×2): qty 1

## 2024-07-01 MED ORDER — MORPHINE SULFATE (PF) 4 MG/ML IV SOLN
4.0000 mg | Freq: Once | INTRAVENOUS | Status: AC
  Administered 2024-07-01: 4 mg via INTRAMUSCULAR
  Filled 2024-07-01: qty 1

## 2024-07-01 MED ORDER — DIPHENHYDRAMINE HCL 25 MG PO CAPS: 50.0000 mg | ORAL_CAPSULE | Freq: Every evening | ORAL | Status: DC | PRN

## 2024-07-01 MED ORDER — ONDANSETRON HCL 4 MG/2ML IJ SOLN
4.0000 mg | Freq: Once | INTRAMUSCULAR | Status: AC
  Administered 2024-07-01: 4 mg via INTRAVENOUS
  Filled 2024-07-01: qty 2

## 2024-07-01 NOTE — ED Notes (Signed)
 Patient transported to Ultrasound

## 2024-07-01 NOTE — ED Notes (Signed)
Pain meds given again.  Family with pt

## 2024-07-01 NOTE — H&P (Signed)
 " Gynecology History and Physical   SERVICE: Gynecology   Patient Name: Brenda Carpenter Patient MRN:   [id number]  CC: left lower abdominal pain  HPI: Brenda Carpenter is a 24 y.o. [id number] with severe left sided pain. Pain started this morning after waking, increased significantly within a few minutes after waking and on arrival to ED pain was severe   Review of Systems: positives in bold GEN:   fevers, chills, weight changes, appetite changes, fatigue, night sweats CV:   CP, palpitations PULM:  SOB, cough GI:  abd pain, N/V/D/C GU:  dysuria, urgency, frequency NEURO:  numbness, weakness PSYCH:  depression, anxiety, behavioral problems, confusion    Past Obstetrical History: OB History     Gravida  3   Para  1   Term  1   Preterm      AB  2   Living  1      SAB  2   IAB      Ectopic      Multiple      Live Births  1           Past Gynecologic History: Patient's last menstrual period was 06/24/2024 (approximate). Menstrual frequency Q 2 wks lasting 6-7 days  Past Medical History: Past Medical History:  Diagnosis Date   Anemia    IDA   Blood transfusion without reported diagnosis     Past Surgical History:   Past Surgical History:  Procedure Laterality Date   CESAREAN SECTION  2023    Family History:  family history is not on file.  Social History:  Social History   Socioeconomic History   Marital status: Single    Spouse name: Not on file   Number of children: Not on file   Years of education: Not on file   Highest education level: Not on file  Occupational History   Not on file  Tobacco Use   Smoking status: Never   Smokeless tobacco: Never  Vaping Use   Vaping status: Never Used  Substance and Sexual Activity   Alcohol use: No   Drug use: No   Sexual activity: Not on file  Other Topics Concern   Not on file  Social History Narrative   Not on file   Social Drivers of Health   Tobacco Use: Low Risk (07/01/2024)   Patient  History    Smoking Tobacco Use: Never    Smokeless Tobacco Use: Never    Passive Exposure: Not on file  Financial Resource Strain: Low Risk (02/28/2024)   Received from [facility]   Overall Financial Resource Strain (CARDIA)    How hard is it for you to pay for the very basics like food, housing, medical care, and heating?: Not very hard  Food Insecurity: Food Insecurity Present (02/28/2024)   Received from [facility]   Epic    Within the past 12 months, you worried that your food would run out before you got the money to buy more.: Never true    Within the past 12 months, the food you bought just didn't last and you didn't have money to get more.: Sometimes true  Transportation Needs: No Transportation Needs (02/28/2024)   Received from [facility]   PRAPARE - Transportation    Lack of Transportation (Medical): No    Lack of Transportation (Non-Medical): No  Physical Activity: Not on file  Stress: Not on file  Social Connections: Not on file  Intimate Partner Violence: Not on  file  Depression (EYV7-0): Not on file  Alcohol Screen: Not on file  Housing: Not on file  Utilities: Low Risk (02/28/2024)   Received from [facility]   Utilities    Within the past 12 months, have you been unable to get utilities(heat, electricity) when it was really needed?: No  Health Literacy: Not on file    Home Medications:  Medications reconciled in EPIC  Medications Ordered Prior to Encounter[1]  Allergies:  Allergies[2]  Physical Exam:  Temp:  [98.3 F (36.8 C)] 98.3 F (36.8 C) (01/06 1208) Pulse Rate:  [77] 77 (01/06 1208) Resp:  [18] 18 (01/06 1208) BP: (116)/(76) 116/76 (01/06 1208) SpO2:  [99 %] 99 % (01/06 1208) Weight:  [70 kg] 70 kg (01/06 1207)   General Appearance:  Well developed, well nourished, no acute distress, alert and oriented, cooperative and appears stated age HEENT:  Normocephalic atraumatic, extraocular movements intact, moist mucous membranes, neck  supple with midline trachea and thyroid without masses Cardiovascular: regular rate Pulmonary:  normal work of breathing Abdomen:  Bowel sounds present, soft, tender to LLQ on light & deep palpation, nondistended, no abnormal masses or organomegaly, no epigastric pain Skin:  normal coloration and turgor, no rashes, no suspicious skin lesions noted  Psychiatric:  Normal mood and affect, appropriate, no AH/VH  Labs/Studies:   CBC and Coags:  Lab Results  Component Value Date   WBC 4.0 07/01/2024   NEUTOPHILPCT 50 03/04/2024   EOSPCT 0 03/04/2024   BASOPCT 1 03/04/2024   LYMPHOPCT 41 03/04/2024   HGB 8.0 (L) 07/01/2024   HCT 29.8 (L) 07/01/2024   MCV 65.6 (L) 07/01/2024   PLT 424 (H) 07/01/2024   CMP:  Lab Results  Component Value Date   NA 138 07/01/2024   K 3.9 07/01/2024   CL 105 07/01/2024   CO2 22 07/01/2024   BUN 9 07/01/2024   CREATININE 0.72 07/01/2024   CREATININE 0.67 03/16/2024   CREATININE 0.71 03/04/2024   PROT 7.5 07/01/2024   BILITOT 0.2 07/01/2024   BILIDIR 0.1 07/28/2023   ALT 15 07/01/2024   AST 16 07/01/2024   ALKPHOS 66 07/01/2024    Other Imaging: US  PELVIC COMPLETE W TRANSVAGINAL AND TORSION R/O Result Date: 07/01/2024 CLINICAL DATA:  Left lower quadrant pain EXAM: TRANSABDOMINAL AND TRANSVAGINAL ULTRASOUND OF PELVIS DOPPLER ULTRASOUND OF OVARIES TECHNIQUE: Both transabdominal and transvaginal ultrasound examinations of the pelvis were performed. Transabdominal technique was performed for global imaging of the pelvis including uterus, ovaries, adnexal regions, and pelvic cul-de-sac. It was necessary to proceed with endovaginal exam following the transabdominal exam to visualize the uterus endometrium adnexa. Color and duplex Doppler ultrasound was utilized to evaluate blood flow to the ovaries. COMPARISON:  Pelvic ultrasound 05/20/2020 FINDINGS: Uterus Measurements: 8.3 x 5 x 5.4 cm = volume: 116.6 mL. No fibroids or other mass visualized. Endometrium  Thickness: 8.5 mm.  No focal abnormality visualized. Right ovary Measurements: 3.4 x 1.7 cm. Sonographer unable to obtain transverse measurements or Doppler waveforms due to patient request to discontinue exam. No obvious adnexal mass Doppler: Declined by patient and therefore not performed Left ovary Measurements: 6.1 x 4.7 x 4 cm = volume: 60.5 mL. Appears slightly enlarged with multiple follicles. Complex area measuring 2.7 by 2.3 x 3 cm. Doppler: There is normal vascularity on color doppler examination. Spectral doppler arterial and venous waveforms are normal. Other findings Small free fluid IMPRESSION: 1. Enlarged left ovary with multiple follicles. Arterial and venous flow present within left ovary  at the time of imaging. Enlarged left ovary could be secondary to polycystic ovary. Other consideration could include recent spontaneous de torsion. Complex area measuring 2.7 cm possibly a hemorrhagic cyst, suggest sonographic follow-up in 6-12 weeks. 2. Right ovary is incompletely evaluated for reasons discussed above. 3. Small free fluid. Electronically Signed   By: Luke Bun M.D.   On: 07/01/2024 16:14     Assessment / Plan:   Brenda Carpenter is a 24 y.o. [id number] who presents with left lower quadrant pain concerning for ovarian torsion. During ultrasound pain improved after fentanyl  administration, initial ultrasound demonstrated concern for torsion, however final read was negative for torsion with possible spontaneous de torsion.   Admit for pain management & observation overnight with possible laparoscopy if severe pain returns.   Exam, labs & ultrasound results discussed with Dr. Verdon, plan urgent laparoscopy overnight if severe pain returns. Plan outpatient management with ultrasound in 6-12w with resolution of pain. Hx of chronic anemia, has received IV iron  in past. T&S ordered in preparation for possible surgery -SCDs -Clear liquids -PRN IV morphine  or dilaudid  if no relief from  morphine     Thank you for the opportunity to be involved with this patient's care.  ----- Brenda Carpenter, CNM [facility]      [1]  No current facility-administered medications on file prior to encounter.   Current Outpatient Medications on File Prior to Encounter  Medication Sig Dispense Refill   acetaminophen  (TYLENOL ) 325 MG tablet Take 650 mg by mouth every 6 (six) hours as needed for moderate pain (pain score 4-6) or mild pain (pain score 1-3).     dextromethorphan-guaiFENesin  (MUCINEX  DM) 30-600 MG 12hr tablet Take 1 tablet by mouth 2 (two) times daily as needed for cough. 60 tablet 0  [2]  Allergies Allergen Reactions   Peanut-Containing Drug Products Itching and Swelling   Vancomycin Itching    Patient reported skin itching and burning   "

## 2024-07-01 NOTE — ED Provider Notes (Signed)
 "  [facility] Provider Note    Event Date/Time   First MD Initiated Contact with Patient 07/01/24 1239     (approximate)   History   Abdominal Pain   HPI  Brenda Carpenter is a 24 y.o. female who presents today for evaluation of left lower quadrant pain that began this morning shortly after waking up.  It did not wake her up from sleep.  She feels nauseated but has not had any vomiting.  She reports that the pain radiates around to her flank.  No fevers or chills.  No changes in her stool.  Patient Active Problem List   Diagnosis Date Noted   Ovarian cyst, complex 07/01/2024   Symptomatic anemia 07/28/2023   Bacterial vaginosis 07/28/2023   Influenza A 07/28/2023   Headache in pregnancy, antepartum, third trimester 02/16/2022   Back pain affecting pregnancy in second trimester 12/13/2021   Vaginitis due to Trichomonas 12/13/2021   Iron  deficiency anemia 12/13/2021   Nausea and vomiting during pregnancy 11/18/2021          Physical Exam   Triage Vital Signs: ED Triage Vitals  Encounter Vitals Group     BP 07/01/24 1208 116/76     Girls Systolic BP Percentile --      Girls Diastolic BP Percentile --      Boys Systolic BP Percentile --      Boys Diastolic BP Percentile --      Pulse Rate 07/01/24 1208 77     Resp 07/01/24 1208 18     Temp 07/01/24 1208 98.3 F (36.8 C)     Temp Source 07/01/24 1208 Oral     SpO2 07/01/24 1208 99 %     Weight 07/01/24 1207 154 lb 5.2 oz (70 kg)     Height 07/01/24 1207 5' 1 (1.549 m)     Head Circumference --      Peak Flow --      Pain Score 07/01/24 1207 10     Pain Loc --      Pain Education --      Exclude from Growth Chart --     Most recent vital signs: Vitals:   07/01/24 1820 07/01/24 1834  BP: 112/74 117/68  Pulse: 65 70  Resp: 18 20  Temp:  98 F (36.7 C)  SpO2: 98% 99%    Physical Exam Vitals and nursing note reviewed.  Constitutional:      General: Awake and alert. Squirming in the  recliner    Appearance: Normal appearance. The patient is normal weight.  HENT:     Head: Normocephalic and atraumatic.     Mouth: Mucous membranes are moist.  Eyes:     General: PERRL. Normal EOMs        Right eye: No discharge.        Left eye: No discharge.     Conjunctiva/sclera: Conjunctivae normal.  Cardiovascular:     Rate and Rhythm: Normal rate and regular rhythm.     Pulses: Normal pulses.  Pulmonary:     Effort: Pulmonary effort is normal. No respiratory distress.     Breath sounds: Normal breath sounds.  Abdominal:     Abdomen is soft. There is mild left lower quadrant abdominal tenderness. No rebound or guarding. No distention.  No CVA tenderness Musculoskeletal:        General: No swelling. Normal range of motion.     Cervical back: Normal range of motion and neck supple.  Skin:    General: Skin is warm and dry.     Capillary Refill: Capillary refill takes less than 2 seconds.     Findings: No rash.  Neurological:     Mental Status: The patient is awake and alert.      ED Results / Procedures / Treatments   Labs (all labs ordered are listed, but only abnormal results are displayed) Labs Reviewed  CBC - Abnormal; Notable for the following components:      Result Value   Hemoglobin 8.0 (*)    HCT 29.8 (*)    MCV 65.6 (*)    MCH 17.6 (*)    MCHC 26.8 (*)    RDW 18.2 (*)    Platelets 424 (*)    All other components within normal limits  URINALYSIS, ROUTINE W REFLEX MICROSCOPIC - Abnormal; Notable for the following components:   Color, Urine YELLOW (*)    APPearance CLEAR (*)    All other components within normal limits  LIPASE, BLOOD  COMPREHENSIVE METABOLIC PANEL WITH GFR  POC URINE PREG, ED  TYPE AND SCREEN     EKG     RADIOLOGY I independently reviewed and interpreted imaging and agree with radiologists findings.     PROCEDURES:  Critical Care performed:   Procedures   MEDICATIONS ORDERED IN ED: Medications  lactated ringers   infusion ( Intravenous New Bag/Given 07/01/24 1841)  ibuprofen  (ADVIL ) tablet 600 mg (has no administration in time range)  ondansetron  (ZOFRAN ) tablet 4 mg (has no administration in time range)    Or  ondansetron  (ZOFRAN ) injection 4 mg (has no administration in time range)  diphenhydrAMINE  (BENADRYL ) capsule 50 mg (has no administration in time range)  docusate sodium  (COLACE) capsule 100 mg (has no administration in time range)  alum & mag hydroxide-simeth (MAALOX/MYLANTA) 200-200-20 MG/5ML suspension 30 mL (has no administration in time range)  simethicone  (MYLICON) chewable tablet 80 mg (has no administration in time range)  morphine  (PF) 2 MG/ML injection 2 mg (2 mg Intravenous Given 07/01/24 1847)  HYDROmorphone  (DILAUDID ) injection 1 mg (has no administration in time range)  ketorolac  (TORADOL ) 15 MG/ML injection 15 mg (15 mg Intravenous Given 07/01/24 1455)  ondansetron  (ZOFRAN ) injection 4 mg (4 mg Intravenous Given 07/01/24 1452)  sodium chloride  0.9 % bolus 1,000 mL (0 mLs Intravenous Stopped 07/01/24 1818)  morphine  (PF) 4 MG/ML injection 4 mg (4 mg Intramuscular Given 07/01/24 1353)  fentaNYL  (SUBLIMAZE ) injection 25 mcg (25 mcg Intravenous Given 07/01/24 1559)     IMPRESSION / MDM / ASSESSMENT AND PLAN / ED COURSE  I reviewed the triage vital signs and the nursing notes.   Differential diagnosis includes, but is not limited to, ovarian cyst, ovarian torsion, ureteral colic, nephrolithiasis.  Patient is awake and alert, squirming in recliner in ER hallway.  She is hemodynamically stable and afebrile. Labs were obtained in triage.  Urine pregnancy is negative, unlikely ectopic.  She was treated symptomatically with Toradol  and Zofran .  I am concerned about possible torsion, patient brought to ultrasound.  I was called by the ultrasound tech who told me that patient had too much pain for transvaginal ultrasound, morphine  IM was brought to her given that she does not yet have an IV.  Patient  will be moved to room when she returns from ultrasound.  Patient reports symptoms persisted when she returned from ultrasound, IV was placed and she was given a round of fentanyl .  Upon reevaluation, patient reports that her pain has improved significantly.  Ultrasound reveals enlarged left ovary with multiple follicles, blood flow is present at the time of imaging.  Per radiology, enlarged left ovary could be secondary to polycystic ovary versus recent spontaneous detorsion.  Given how severe her pain was, and then how abruptly her pain improved, I am concerned about intermittent torsion.  I consulted OB/GYN who came to evaluate the patient and has recommended admission overnight for observation, with plan for urgent laparoscopy overnight if severe pain returns.  Patient understands and agrees with plan.  She was admitted to the OB/GYN floor.  Patient's presentation is most consistent with acute presentation with potential threat to life or bodily function.   Clinical Course as of 07/01/24 1918  Tue Jul 01, 2024  1621 I discussed with Harlene Cisco who will come to evaluate the patient and discussed with Dr. Verdon.  Patient is resting comfortably at this time [JP]  1642 OB/GYN at bedside. [JP]    Clinical Course User Index [JP] Lenetta Piche E, PA-C     FINAL CLINICAL IMPRESSION(S) / ED DIAGNOSES   Final diagnoses:  Enlarged ovary  Left lower quadrant abdominal pain     Rx / DC Orders   ED Discharge Orders     None        Note:  This document was prepared using Dragon voice recognition software and may include unintentional dictation errors.   Anadia Helmes E, PA-C 07/01/24 1918    Bradler, Evan K, MD 07/01/24 [id number]  "

## 2024-07-01 NOTE — ED Triage Notes (Signed)
 Pt endorses LLQ abd pain and nausea since 8 am. Last BM yesterday. Reports intermittent spotting for a month before last period a week ago.

## 2024-07-01 NOTE — ED Notes (Signed)
 Report called to crystal rn ob/gynnurse

## 2024-07-02 ENCOUNTER — Other Ambulatory Visit: Payer: Self-pay | Admitting: Certified Nurse Midwife

## 2024-07-02 ENCOUNTER — Telehealth: Payer: Self-pay | Admitting: Certified Nurse Midwife

## 2024-07-02 MED ORDER — IBUPROFEN 600 MG PO TABS: 600.0000 mg | ORAL_TABLET | Freq: Four times a day (QID) | ORAL | Status: AC | PRN

## 2024-07-02 NOTE — Discharge Summary (Signed)
 Physician Discharge Summary  Patient ID: Brenda Carpenter MRN: [id number] DOB/AGE: 11/16/2000 24 y.o.  Admit date: 07/01/2024 Discharge date: 07/02/2024  Admission Diagnoses:  Discharge Diagnoses:  Principal Problem:   Ovarian cyst, complex   Discharged Condition: good  Hospital Course: Presented to ED with severe left sided pain on 07/02/2023. Pain started this morning after waking, increased significantly within a few minutes after waking and on arrival to ED pain was severe. During ultrasound pain improved after fentanyl  administration, initial ultrasound demonstrated concern for torsion, however final read was negative for torsion with possible spontaneous de torsion. Patient was observed overnight and continues with well controlled pain.   Discharge Exam: Blood pressure 102/65, pulse 60, temperature 98.8 F (37.1 C), temperature source Oral, resp. rate 20, height 5' 1 (1.549 m), weight 70 kg, last menstrual period 06/24/2024, SpO2 99%. General appearance: alert and cooperative GI: soft, non-tender; bowel sounds normal; no masses,  no organomegaly  Plan: Discharge home with close follow by at [facility]. US  ordered for 6 weeks. Instructed to return to ED if pain returns.   Disposition: Discharge disposition: 01-Home or Self Care       Discharge Instructions     Activity as tolerated   Complete by: As directed    Call MD for:  severe uncontrolled pain   Complete by: As directed    Discharge instructions   Complete by: As directed    Return to ER with increased pain      Allergies as of 07/02/2024       Reactions   Peanut-containing Drug Products Itching, Swelling   Vancomycin Itching   Patient reported skin itching and burning        Medication List     TAKE these medications    acetaminophen  325 MG tablet Commonly known as: TYLENOL  Take 650 mg by mouth every 6 (six) hours as needed for moderate pain (pain score 4-6) or mild pain (pain score 1-3).    dextromethorphan-guaiFENesin  30-600 MG 12hr tablet Commonly known as: MUCINEX  DM Take 1 tablet by mouth 2 (two) times daily as needed for cough.   ibuprofen  600 MG tablet Commonly known as: ADVIL  Take 1 tablet (600 mg total) by mouth every 6 (six) hours as needed (mild pain).         SignedBETHA Damien PARSLEY 07/02/2024, 11:58 AM

## 2024-07-02 NOTE — Discharge Instructions (Signed)

## 2024-07-02 NOTE — Telephone Encounter (Signed)
 Contacted the patient via phone. I left message for the patient to contact our office. The patient needs an follow up transvaginal US  in about 6 weeks and then a visit with Harlene after the US  around 08/13/24.

## 2024-07-03 ENCOUNTER — Encounter: Payer: Self-pay | Admitting: Certified Nurse Midwife

## 2024-07-03 DIAGNOSIS — N838 Other noninflammatory disorders of ovary, fallopian tube and broad ligament: Secondary | ICD-10-CM

## 2024-07-04 NOTE — Telephone Encounter (Signed)
 Contacted the patient via phone, she is scheduled for 1/20 with Harlene Cisco and 2/20 for ultrasound.

## 2024-07-07 NOTE — Telephone Encounter (Signed)
 Patient states she was seen in ED on 07/01/24 for ovarian torsion and has a cyst. She reports she doesn't have a f/u appointment until 07/15/24. She is returning to work today. She advised her job requires that she stand all day. 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 Requesting work note. Advised will send to on call provider for approval.

## 2024-07-14 NOTE — Patient Instructions (Signed)
 Polycystic Ovary Syndrome  Polycystic ovarian syndrome (PCOS) is a common hormonal disorder among women of reproductive age. In most women with PCOS, small fluid-filled sacs (cysts) grow on the ovaries. PCOS can cause problems with menstrual periods and make it hard to get and stay pregnant. If this condition is not treated, it can lead to serious health problems, such as diabetes and heart disease. What are the causes? The cause of this condition is not known. It may be due to certain factors, such as: Irregular menstrual cycle. High levels of certain hormones. Problems with the hormone that helps to control blood sugar (insulin). Certain genes. What increases the risk? You are more likely to develop this condition if you: Have a family history of PCOS or type 2 diabetes. Are overweight, eat unhealthy foods, and are not active. These factors may cause problems with blood sugar control, which can contribute to PCOS or PCOS symptoms. What are the signs or symptoms? Symptoms of this condition include: Ovarian cysts and sometimes pelvic pain. Menstrual periods that are not regular or are too heavy. Inability to get or stay pregnant. Increased growth of hair on the face, chest, stomach, back, thumbs, thighs, or toes. Acne or oily skin. Acne may develop during adulthood, and it may not get better with treatment. Weight gain or obesity. Patches of thickened and dark brown or black skin on the neck, arms, breasts, or thighs. How is this diagnosed? This condition is diagnosed based on: Your medical history. A physical exam that includes a pelvic exam. Your health care provider may look for areas of increased hair growth on your skin. Tests, such as: An ultrasound to check the ovaries for cysts and to view the lining of the uterus. Blood tests to check levels of sugar (glucose), female hormone (testosterone), and female hormones (estrogen and progesterone). How is this treated? There is no  cure for this condition, but treatment can help to manage symptoms and prevent more health problems from developing. Treatment varies depending on your symptoms and if you want to have a baby or if you need birth control. Treatment may include: Making nutrition and lifestyle changes. Taking the progesterone hormone to start a menstrual period. Taking birth control pills to help you have regular menstrual periods. Taking medicines such as: Medicines to make you ovulate, if you want to get pregnant. Medicine to reduce extra hair growth. Having surgery in severe cases. This may involve making small holes in one or both of your ovaries. This decreases the amount of testosterone that your body makes. Follow these instructions at home: Take over-the-counter and prescription medicines only as told by your health care provider. Follow a healthy meal plan that includes lean proteins, complex carbohydrates, fresh fruits and vegetables, low-fat dairy products, healthy fats, and fiber. If you are overweight, lose weight as told by your health care provider. Your health care provider can determine how much weight loss is best for you and can help you lose weight safely. Keep all follow-up visits. This is important. Contact a health care provider if: Your symptoms do not get better with medicine. Your symptoms get worse or you develop new symptoms. Summary Polycystic ovarian syndrome (PCOS) is a common hormonal disorder among women of reproductive age. PCOS can cause problems with menstrual periods and make it hard to get and stay pregnant. If this condition is not treated, it can lead to serious health problems, such as diabetes and heart disease. There is no cure for this condition, but  treatment can help to manage symptoms and prevent more health problems from developing. This information is not intended to replace advice given to you by your health care provider. Make sure you discuss any questions you  have with your health care provider. Document Revised: 04/30/2023 Document Reviewed: 04/30/2023 Elsevier Patient Education  2024 [employer].

## 2024-07-14 NOTE — Progress Notes (Unsigned)
 "   Center, [facility]   No chief complaint on file.   HPI:      Brenda Carpenter is a 24 y.o. [id number] whose LMP was Patient's last menstrual period was 06/24/2024 (approximate)., presents today for ER follow up. Patient was seen at [facility] ER opn 07/01/24 with complaints of LLQ pain. Ultrasound performed showed a enlarged left ovary with multiple follicles that could be secondary to polycystic ovary. Complex area was note measuring 2.7cm as a possible hemorrhagic cyst.      Patient Active Problem List   Diagnosis Date Noted   Ovarian cyst, complex 07/01/2024   Symptomatic anemia 07/28/2023   Bacterial vaginosis 07/28/2023   Influenza A 07/28/2023   Headache in pregnancy, antepartum, third trimester 02/16/2022   Back pain affecting pregnancy in second trimester 12/13/2021   Vaginitis due to Trichomonas 12/13/2021   Iron  deficiency anemia 12/13/2021   Nausea and vomiting during pregnancy 11/18/2021    Past Surgical History:  Procedure Laterality Date   CESAREAN SECTION  2023    No family history on file.  Social History   Socioeconomic History   Marital status: Single    Spouse name: Not on file   Number of children: Not on file   Years of education: Not on file   Highest education level: Not on file  Occupational History   Not on file  Tobacco Use   Smoking status: Never   Smokeless tobacco: Never  Vaping Use   Vaping status: Never Used  Substance and Sexual Activity   Alcohol use: No   Drug use: No   Sexual activity: Not on file  Other Topics Concern   Not on file  Social History Narrative   Not on file   Social Drivers of Health   Tobacco Use: Low Risk (07/01/2024)   Patient History    Smoking Tobacco Use: Never    Smokeless Tobacco Use: Never    Passive Exposure: Not on file  Financial Resource Strain: Low Risk (02/28/2024)   Received from [facility]   Overall Financial Resource Strain (CARDIA)    How hard is it for you to pay for the  very basics like food, housing, medical care, and heating?: Not very hard  Food Insecurity: No Food Insecurity (07/01/2024)   Epic    Worried About Radiation Protection Practitioner of Food in the Last Year: Never true    Ran Out of Food in the Last Year: Never true  Transportation Needs: No Transportation Needs (07/01/2024)   Epic    Lack of Transportation (Medical): No    Lack of Transportation (Non-Medical): No  Physical Activity: Not on file  Stress: Not on file  Social Connections: Not on file  Intimate Partner Violence: Not on file  Depression (EYV7-0): Not on file  Alcohol Screen: Not on file  Housing: Low Risk (07/01/2024)   Epic    Unable to Pay for Housing in the Last Year: No    Number of Times Moved in the Last Year: 1    Homeless in the Last Year: No  Utilities: Not At Risk (07/01/2024)   Epic    Threatened with loss of utilities: No  Health Literacy: Not on file    Outpatient Medications Prior to Visit  Medication Sig Dispense Refill   acetaminophen  (TYLENOL ) 325 MG tablet Take 650 mg by mouth every 6 (six) hours as needed for moderate pain (pain score 4-6) or mild pain (pain score 1-3).     dextromethorphan-guaiFENesin  (  MUCINEX  DM) 30-600 MG 12hr tablet Take 1 tablet by mouth 2 (two) times daily as needed for cough. 60 tablet 0   ibuprofen  (ADVIL ) 600 MG tablet Take 1 tablet (600 mg total) by mouth every 6 (six) hours as needed (mild pain).     No facility-administered medications prior to visit.      ROS:  Review of Systems   OBJECTIVE:   Vitals:  LMP 06/24/2024 (Approximate)   Physical Exam  Results: No results found for this or any previous visit (from the past 24 hours).   Assessment/Plan: 1. Ovarian cyst, complex (Primary)     No orders of the defined types were placed in this encounter.    Rollo JINNY Maxin, CMA 07/14/2024 1:52 PM       "

## 2024-07-15 ENCOUNTER — Ambulatory Visit: Payer: Self-pay | Admitting: Certified Nurse Midwife

## 2024-07-15 VITALS — BP 102/63 | HR 81 | Ht 67.0 in | Wt 154.7 lb

## 2024-07-15 DIAGNOSIS — N83299 Other ovarian cyst, unspecified side: Secondary | ICD-10-CM

## 2024-07-15 DIAGNOSIS — N921 Excessive and frequent menstruation with irregular cycle: Secondary | ICD-10-CM

## 2024-07-16 LAB — CBC
Hematocrit: 32.3 % — ABNORMAL LOW (ref 34.0–46.6)
Hemoglobin: 8.8 g/dL — ABNORMAL LOW (ref 11.1–15.9)
MCH: 18.1 pg — ABNORMAL LOW (ref 26.6–33.0)
MCHC: 27.2 g/dL — ABNORMAL LOW (ref 31.5–35.7)
MCV: 67 fL — ABNORMAL LOW (ref 79–97)
Platelets: 308 x10E3/uL (ref 150–450)
RBC: 4.86 x10E6/uL (ref 3.77–5.28)
RDW: 17.2 % — ABNORMAL HIGH (ref 11.7–15.4)
WBC: 3.6 x10E3/uL (ref 3.4–10.8)

## 2024-07-16 MED ORDER — LORAZEPAM 0.5 MG PO TABS: 0.5000 mg | ORAL_TABLET | ORAL | 0 refills | Status: AC | PRN

## 2024-07-16 NOTE — Telephone Encounter (Signed)
 Work note was provided on 07/02/24 for no lifting over 10-15 lbs and no strenuous activity. Patient seen for f/u on 07/15/24

## 2024-07-18 ENCOUNTER — Ambulatory Visit: Payer: Self-pay

## 2024-07-18 DIAGNOSIS — N838 Other noninflammatory disorders of ovary, fallopian tube and broad ligament: Secondary | ICD-10-CM

## 2024-07-22 ENCOUNTER — Ambulatory Visit: Payer: Self-pay | Admitting: Certified Nurse Midwife

## 2024-07-22 NOTE — Patient Instructions (Incomplete)

## 2024-07-22 NOTE — Progress Notes (Unsigned)
" ° °  No chief complaint on file.    IUD PROCEDURE NOTE:  Brenda Carpenter is a 24 y.o. [phone] here for {type:23561} IUD insertion for ***   LMP 06/24/2024 (Approximate)   IUD Insertion Procedure Note Patient identified, informed consent performed, consent signed. Discussed risks of irregular bleeding, cramping, infection, malpositioning or misplacement of the IUD outside the uterus which may require further procedure such as laparoscopy, risk of failure <1%. Time out was performed. Urine pregnancy test negative.***  Speculum placed in the vagina. Cervix visualized. Cleaned with Betadine x 2. Grasped anteriorly with Allis clamp. Uterus sounded to *** cm. IUD placed per manufacturer's recommendations. Strings trimmed to 3 cm. Allis was removed, good hemostasis noted.  Patient tolerated procedure well.   ASSESSMENT:  Encounter for insertion of intrauterine contraceptive device (IUD)   No orders of the defined types were placed in this encounter.    Plan:  Patient was given post-procedure instructions. She was advised to have backup contraception for one week.  Call if you are having increasing pain, cramps or bleeding or if you have a fever greater than 100.4 degrees F., shaking chills, nausea or vomiting. Patient was also asked to check IUD strings periodically and follow up in 4 weeks for IUD check.  No follow-ups on file.  Rollo JINNY Maxin, CMA 07/22/2024 4:05 PM     "

## 2024-07-23 ENCOUNTER — Ambulatory Visit: Payer: Self-pay | Admitting: Certified Nurse Midwife

## 2024-07-23 DIAGNOSIS — Z30430 Encounter for insertion of intrauterine contraceptive device: Secondary | ICD-10-CM

## 2024-08-15 ENCOUNTER — Other Ambulatory Visit: Payer: Self-pay
